# Patient Record
Sex: Male | Born: 1965 | Race: Black or African American | Hispanic: No | Marital: Married | Smoking: Never smoker
Health system: Southern US, Community
[De-identification: ages and names within clinical notes are randomized; demographics above are authoritative.]

## PROBLEM LIST (undated history)

## (undated) DIAGNOSIS — M1990 Unspecified osteoarthritis, unspecified site: Secondary | ICD-10-CM

## (undated) DIAGNOSIS — N189 Chronic kidney disease, unspecified: Secondary | ICD-10-CM

## (undated) DIAGNOSIS — K219 Gastro-esophageal reflux disease without esophagitis: Secondary | ICD-10-CM

## (undated) DIAGNOSIS — I5031 Acute diastolic (congestive) heart failure: Secondary | ICD-10-CM

## (undated) DIAGNOSIS — I10 Essential (primary) hypertension: Secondary | ICD-10-CM

## (undated) HISTORY — PX: FOOT SURGERY: SHX648

## (undated) HISTORY — PX: FRACTURE SURGERY: SHX138

---

## 2015-02-02 ENCOUNTER — Emergency Department (HOSPITAL_COMMUNITY): Payer: BLUE CROSS/BLUE SHIELD

## 2015-02-02 ENCOUNTER — Encounter (HOSPITAL_COMMUNITY): Payer: Self-pay | Admitting: Emergency Medicine

## 2015-02-02 ENCOUNTER — Inpatient Hospital Stay (HOSPITAL_COMMUNITY)
Admission: EM | Admit: 2015-02-02 | Discharge: 2015-02-04 | DRG: 291 | Disposition: A | Discharge status: Home / Self Care | Payer: BLUE CROSS/BLUE SHIELD | Attending: Internal Medicine | Admitting: Internal Medicine

## 2015-02-02 ENCOUNTER — Inpatient Hospital Stay (HOSPITAL_COMMUNITY): Payer: BLUE CROSS/BLUE SHIELD

## 2015-02-02 DIAGNOSIS — I5041 Acute combined systolic (congestive) and diastolic (congestive) heart failure: Secondary | ICD-10-CM | POA: Diagnosis present

## 2015-02-02 DIAGNOSIS — N179 Acute kidney failure, unspecified: Secondary | ICD-10-CM | POA: Diagnosis present

## 2015-02-02 DIAGNOSIS — T502X5A Adverse effect of carbonic-anhydrase inhibitors, benzothiadiazides and other diuretics, initial encounter: Secondary | ICD-10-CM | POA: Diagnosis present

## 2015-02-02 DIAGNOSIS — Z9119 Patient's noncompliance with other medical treatment and regimen: Secondary | ICD-10-CM | POA: Diagnosis present

## 2015-02-02 DIAGNOSIS — R0600 Dyspnea, unspecified: Secondary | ICD-10-CM | POA: Diagnosis not present

## 2015-02-02 DIAGNOSIS — N289 Disorder of kidney and ureter, unspecified: Secondary | ICD-10-CM

## 2015-02-02 DIAGNOSIS — E876 Hypokalemia: Secondary | ICD-10-CM | POA: Diagnosis present

## 2015-02-02 DIAGNOSIS — I160 Hypertensive urgency: Secondary | ICD-10-CM

## 2015-02-02 DIAGNOSIS — I129 Hypertensive chronic kidney disease with stage 1 through stage 4 chronic kidney disease, or unspecified chronic kidney disease: Secondary | ICD-10-CM | POA: Diagnosis present

## 2015-02-02 DIAGNOSIS — I5021 Acute systolic (congestive) heart failure: Secondary | ICD-10-CM | POA: Diagnosis present

## 2015-02-02 DIAGNOSIS — J9601 Acute respiratory failure with hypoxia: Secondary | ICD-10-CM | POA: Diagnosis present

## 2015-02-02 DIAGNOSIS — R0602 Shortness of breath: Secondary | ICD-10-CM | POA: Diagnosis present

## 2015-02-02 DIAGNOSIS — Z888 Allergy status to other drugs, medicaments and biological substances status: Secondary | ICD-10-CM

## 2015-02-02 DIAGNOSIS — I5031 Acute diastolic (congestive) heart failure: Secondary | ICD-10-CM | POA: Diagnosis not present

## 2015-02-02 DIAGNOSIS — I10 Essential (primary) hypertension: Secondary | ICD-10-CM

## 2015-02-02 DIAGNOSIS — J810 Acute pulmonary edema: Secondary | ICD-10-CM

## 2015-02-02 DIAGNOSIS — N189 Chronic kidney disease, unspecified: Secondary | ICD-10-CM | POA: Diagnosis present

## 2015-02-02 HISTORY — DX: Acute diastolic (congestive) heart failure: I50.31

## 2015-02-02 HISTORY — DX: Essential (primary) hypertension: I10

## 2015-02-02 LAB — CBC WITH DIFFERENTIAL/PLATELET
Basophils Absolute: 0 10*3/uL (ref 0.0–0.1)
Basophils Relative: 0 % (ref 0–1)
EOS ABS: 0.1 10*3/uL (ref 0.0–0.7)
Eosinophils Relative: 1 % (ref 0–5)
HEMATOCRIT: 42.8 % (ref 39.0–52.0)
HEMOGLOBIN: 14.6 g/dL (ref 13.0–17.0)
Lymphocytes Relative: 39 % (ref 12–46)
Lymphs Abs: 2.9 10*3/uL (ref 0.7–4.0)
MCH: 30.9 pg (ref 26.0–34.0)
MCHC: 34.1 g/dL (ref 30.0–36.0)
MCV: 90.7 fL (ref 78.0–100.0)
Monocytes Absolute: 0.6 10*3/uL (ref 0.1–1.0)
Monocytes Relative: 8 % (ref 3–12)
NEUTROS PCT: 52 % (ref 43–77)
Neutro Abs: 3.8 10*3/uL (ref 1.7–7.7)
PLATELETS: 189 10*3/uL (ref 150–400)
RBC: 4.72 MIL/uL (ref 4.22–5.81)
RDW: 13.6 % (ref 11.5–15.5)
WBC: 7.4 10*3/uL (ref 4.0–10.5)

## 2015-02-02 LAB — BRAIN NATRIURETIC PEPTIDE: B NATRIURETIC PEPTIDE 5: 559.4 pg/mL — AB (ref 0.0–100.0)

## 2015-02-02 LAB — HEPATIC FUNCTION PANEL
ALT: 23 U/L (ref 0–53)
AST: 27 U/L (ref 0–37)
Albumin: 3.3 g/dL — ABNORMAL LOW (ref 3.5–5.2)
Alkaline Phosphatase: 86 U/L (ref 39–117)
Bilirubin, Direct: 0.1 mg/dL (ref 0.0–0.5)
Indirect Bilirubin: 1 mg/dL — ABNORMAL HIGH (ref 0.3–0.9)
Total Bilirubin: 1.1 mg/dL (ref 0.3–1.2)
Total Protein: 6.9 g/dL (ref 6.0–8.3)

## 2015-02-02 LAB — I-STAT CHEM 8, ED
BUN: 41 mg/dL — ABNORMAL HIGH (ref 6–23)
CHLORIDE: 105 mmol/L (ref 96–112)
CREATININE: 2.5 mg/dL — AB (ref 0.50–1.35)
Calcium, Ion: 1.16 mmol/L (ref 1.12–1.23)
GLUCOSE: 89 mg/dL (ref 70–99)
HCT: 45 % (ref 39.0–52.0)
Hemoglobin: 15.3 g/dL (ref 13.0–17.0)
POTASSIUM: 3.8 mmol/L (ref 3.5–5.1)
Sodium: 142 mmol/L (ref 135–145)
TCO2: 23 mmol/L (ref 0–100)

## 2015-02-02 LAB — URINALYSIS, ROUTINE W REFLEX MICROSCOPIC
BILIRUBIN URINE: NEGATIVE
GLUCOSE, UA: NEGATIVE mg/dL
KETONES UR: NEGATIVE mg/dL
LEUKOCYTES UA: NEGATIVE
Nitrite: NEGATIVE
Protein, ur: 30 mg/dL — AB
Specific Gravity, Urine: 1.007 (ref 1.005–1.030)
UROBILINOGEN UA: 0.2 mg/dL (ref 0.0–1.0)
pH: 7 (ref 5.0–8.0)

## 2015-02-02 LAB — URINE MICROSCOPIC-ADD ON

## 2015-02-02 LAB — CREATININE, SERUM
Creatinine, Ser: 2.76 mg/dL — ABNORMAL HIGH (ref 0.50–1.35)
GFR calc Af Amer: 30 mL/min — ABNORMAL LOW (ref >90)
GFR calc non Af Amer: 26 mL/min — ABNORMAL LOW (ref >90)

## 2015-02-02 LAB — RAPID URINE DRUG SCREEN, HOSP PERFORMED
AMPHETAMINES: NOT DETECTED
Barbiturates: NOT DETECTED
Benzodiazepines: NOT DETECTED
Cocaine: NOT DETECTED
Opiates: NOT DETECTED
TETRAHYDROCANNABINOL: NOT DETECTED

## 2015-02-02 LAB — TROPONIN I
Troponin I: 0.05 ng/mL — ABNORMAL HIGH (ref <0.031)
Troponin I: 0.06 ng/mL — ABNORMAL HIGH (ref <0.031)
Troponin I: 0.06 ng/mL — ABNORMAL HIGH (ref <0.031)

## 2015-02-02 LAB — I-STAT TROPONIN, ED: TROPONIN I, POC: 0.02 ng/mL (ref 0.00–0.08)

## 2015-02-02 MED ORDER — SODIUM CHLORIDE 0.9 % IJ SOLN
3.0000 mL | INTRAMUSCULAR | Status: DC | PRN
Start: 2015-02-02 — End: 2015-02-04
  Administered 2015-02-02: 3 mL via INTRAVENOUS
  Filled 2015-02-02: qty 3

## 2015-02-02 MED ORDER — ACETAMINOPHEN 325 MG PO TABS
650.0000 mg | ORAL_TABLET | ORAL | Status: DC | PRN
  Administered 2015-02-02 – 2015-02-03 (×2): 650 mg via ORAL
  Filled 2015-02-02 (×2): qty 2

## 2015-02-02 MED ORDER — HEPARIN SODIUM (PORCINE) 5000 UNIT/ML IJ SOLN
5000.0000 [IU] | Freq: Three times a day (TID) | INTRAMUSCULAR | Status: DC
  Administered 2015-02-02 – 2015-02-04 (×6): 5000 [IU] via SUBCUTANEOUS
  Filled 2015-02-02 (×7): qty 1

## 2015-02-02 MED ORDER — HYDRALAZINE HCL 10 MG PO TABS
10.0000 mg | ORAL_TABLET | Freq: Three times a day (TID) | ORAL | Status: DC
  Administered 2015-02-02 – 2015-02-04 (×7): 10 mg via ORAL
  Filled 2015-02-02 (×8): qty 1

## 2015-02-02 MED ORDER — FUROSEMIDE 10 MG/ML IJ SOLN
20.0000 mg | Freq: Two times a day (BID) | INTRAMUSCULAR | Status: DC
  Administered 2015-02-02 – 2015-02-04 (×5): 20 mg via INTRAVENOUS
  Filled 2015-02-02 (×5): qty 2

## 2015-02-02 MED ORDER — LABETALOL HCL 5 MG/ML IV SOLN: 10.0000 mg | Freq: Once | INTRAVENOUS | Status: DC

## 2015-02-02 MED ORDER — LOSARTAN POTASSIUM 50 MG PO TABS
50.0000 mg | ORAL_TABLET | Freq: Two times a day (BID) | ORAL | Status: DC
  Administered 2015-02-02 (×2): 50 mg via ORAL
  Filled 2015-02-02 (×3): qty 1

## 2015-02-02 MED ORDER — FUROSEMIDE 10 MG/ML IJ SOLN
40.0000 mg | Freq: Once | INTRAMUSCULAR | Status: AC
  Administered 2015-02-02: 40 mg via INTRAVENOUS
  Filled 2015-02-02: qty 4

## 2015-02-02 MED ORDER — IPRATROPIUM-ALBUTEROL 0.5-2.5 (3) MG/3ML IN SOLN
RESPIRATORY_TRACT | Status: AC
  Filled 2015-02-02: qty 3

## 2015-02-02 MED ORDER — NITROGLYCERIN 2 % TD OINT
1.0000 [in_us] | TOPICAL_OINTMENT | Freq: Once | TRANSDERMAL | Status: AC
  Administered 2015-02-02: 1 [in_us] via TOPICAL
  Filled 2015-02-02: qty 30

## 2015-02-02 MED ORDER — ISOSORBIDE MONONITRATE 15 MG HALF TABLET
15.0000 mg | ORAL_TABLET | Freq: Every day | ORAL | Status: DC
  Administered 2015-02-02 – 2015-02-04 (×3): 15 mg via ORAL
  Filled 2015-02-02 (×3): qty 1

## 2015-02-02 MED ORDER — ASPIRIN EC 81 MG PO TBEC
81.0000 mg | DELAYED_RELEASE_TABLET | Freq: Every day | ORAL | Status: DC
  Administered 2015-02-02 – 2015-02-04 (×3): 81 mg via ORAL
  Filled 2015-02-02 (×3): qty 1

## 2015-02-02 MED ORDER — CARVEDILOL 3.125 MG PO TABS
3.1250 mg | ORAL_TABLET | Freq: Two times a day (BID) | ORAL | Status: DC
  Administered 2015-02-02 – 2015-02-04 (×5): 3.125 mg via ORAL
  Filled 2015-02-02 (×5): qty 1

## 2015-02-02 MED ORDER — HYDRALAZINE HCL 20 MG/ML IJ SOLN
5.0000 mg | Freq: Once | INTRAMUSCULAR | Status: AC
  Administered 2015-02-02: 5 mg via INTRAVENOUS
  Filled 2015-02-02: qty 1

## 2015-02-02 MED ORDER — POLYVINYL ALCOHOL 1.4 % OP SOLN
1.0000 [drp] | Freq: Three times a day (TID) | OPHTHALMIC | Status: DC | PRN
  Filled 2015-02-02: qty 15

## 2015-02-02 MED ORDER — IPRATROPIUM-ALBUTEROL 0.5-2.5 (3) MG/3ML IN SOLN
3.0000 mL | Freq: Once | RESPIRATORY_TRACT | Status: AC
  Administered 2015-02-02: 3 mL via RESPIRATORY_TRACT

## 2015-02-02 MED ORDER — SODIUM CHLORIDE 0.9 % IJ SOLN
3.0000 mL | Freq: Two times a day (BID) | INTRAMUSCULAR | Status: DC
  Administered 2015-02-03 – 2015-02-04 (×4): 3 mL via INTRAVENOUS

## 2015-02-02 MED ORDER — HYDRALAZINE HCL 20 MG/ML IJ SOLN
10.0000 mg | Freq: Four times a day (QID) | INTRAMUSCULAR | Status: DC | PRN
  Administered 2015-02-02 – 2015-02-03 (×2): 10 mg via INTRAVENOUS
  Filled 2015-02-02 (×2): qty 1

## 2015-02-02 MED ORDER — HYPROMELLOSE (GONIOSCOPIC) 2.5 % OP SOLN
1.0000 [drp] | Freq: Three times a day (TID) | OPHTHALMIC | Status: DC | PRN
  Filled 2015-02-02: qty 15

## 2015-02-02 MED ORDER — SODIUM CHLORIDE 0.9 % IV SOLN
250.0000 mL | INTRAVENOUS | Status: DC | PRN
Start: 2015-02-02 — End: 2015-02-04

## 2015-02-02 MED ORDER — ASPIRIN EC 81 MG PO TBEC: 81.0000 mg | DELAYED_RELEASE_TABLET | Freq: Every day | ORAL | Status: DC

## 2015-02-02 MED ORDER — ONDANSETRON HCL 4 MG/2ML IJ SOLN: 4.0000 mg | Freq: Four times a day (QID) | INTRAMUSCULAR | Status: DC | PRN

## 2015-02-02 NOTE — ED Notes (Signed)
Pt reports onset of SOB and wheezing tonight. Pt is not sure if he has asthma but reports SOB has happened before but went away away on its own.

## 2015-02-02 NOTE — H&P (Signed)
[facility] History and Physical  Cameron Peterson [id number] DOB: August 14, 1966 DOA: 02/02/2015  Referring physician:  PCP: No primary care provider on file.   Chief Complaint: SOB  HPI: Cameron Peterson is a 49 y.o. male with past medical history of hypertension, who comes in for shortness of breath that started 4 days prior to admission he relates that he noticed shortness of breath with exertion but did not pay much attention to it. But over the last 2 days he has not been able to sleep flat as he wakes up shortness of breath. 24 hours prior to admission he relates his PND got worse so he decided to come to the hospital. He denies any chest pain in the recent viral infections. He relates he has not been compliant with his hypertension medication. He relates no other new medications.  In the ED: He was found to be hypoxic on admission with a blood pressure of 200/123, new acute renal failure, chest x-ray that shows an interstitial diffuse pattern, with elevated BNP.   Review of Systems:  Constitutional:  No weight loss, night sweats, Fevers, chills, fatigue.  HEENT:  No headaches, Difficulty swallowing,Tooth/dental problems,Sore throat,  No sneezing, itching, ear ache, nasal congestion, post nasal drip,  Cardio-vascular:  swelling in lower extremities, anasarca, dizziness, palpitations  GI:  No heartburn, indigestion, abdominal pain, nausea, vomiting, diarrhea, change in bowel habits, loss of appetite  Resp:   No excess mucus, no productive cough, No non-productive cough, No coughing up of blood.No change in color of mucus.No wheezing.No chest wall deformity  Skin:  no rash or lesions.  GU:  no dysuria, change in color of urine, no urgency or frequency. No flank pain.  Musculoskeletal:  No joint pain or swelling. No decreased range of motion. No back pain.  Psych:  No change in mood or affect. No depression or anxiety. No memory loss.   Past Medical History  Diagnosis  Date  . Hypertension    History reviewed. No pertinent past surgical history. Social History:  reports that he has never smoked. He does not have any smokeless tobacco history on file. He reports that he drinks alcohol. He reports that he does not use illicit drugs.  Allergies  Allergen Reactions  . Lisinopril Swelling    Family History  Problem Relation Age of Onset  . Diabetes Mellitus II Father    Prior to Admission medications   Medication Sig Start Date End Date Taking? Authorizing Provider  aspirin EC 81 MG tablet Take 81 mg by mouth daily.   Yes Historical Provider, MD  hydroxypropyl methylcellulose / hypromellose (ISOPTO TEARS / GONIOVISC) 2.5 % ophthalmic solution Place 1 drop into both eyes 3 (three) times daily as needed for dry eyes.   Yes Historical Provider, MD  losartan (COZAAR) 50 MG tablet Take 50 mg by mouth 2 (two) times daily.   Yes Historical Provider, MD   Physical Exam: Filed Vitals:   02/02/15 0345 02/02/15 0451 02/02/15 0600 02/02/15 0630  BP: 210/130 190/130 192/103 180/125  Pulse:   64 67  Temp:      TempSrc:      Resp:   23 28  SpO2:   98% 99%    Wt Readings from Last 3 Encounters:  No data found for Wt    General:  Appears calm and comfortable Eyes: PERRL, normal lids, irises & conjunctiva ENT: grossly normal hearing, lips & tongue Neck: no LAD, masses or thyromegaly, +JVD Cardiovascular: RRR, no m/r/g. No LE edema.  Telemetry: SR, no arrhythmias  Respiratory: CTA bilaterally, no w/r/r. Normal respiratory effort. Abdomen: soft, ntnd Skin: no rash or induration seen on limited exam Musculoskeletal: grossly normal tone BUE/BLE Psychiatric: grossly normal mood and affect, speech fluent and appropriate Neurologic: grossly non-focal.          Labs on Admission:  Basic Metabolic Panel:  Recent Labs Lab 02/02/15 0456  NA 142  K 3.8  CL 105  GLUCOSE 89  BUN 41*  CREATININE 2.50*   Liver Function Tests:  Recent Labs Lab  02/02/15 0453  AST 27  ALT 23  ALKPHOS 86  BILITOT 1.1  PROT 6.9  ALBUMIN 3.3*   No results for input(s): LIPASE, AMYLASE in the last 168 hours. No results for input(s): AMMONIA in the last 168 hours. CBC:  Recent Labs Lab 02/02/15 0453 02/02/15 0456  WBC 7.4  --   NEUTROABS 3.8  --   HGB 14.6 15.3  HCT 42.8 45.0  MCV 90.7  --   PLT 189  --    Cardiac Enzymes: No results for input(s): CKTOTAL, CKMB, CKMBINDEX, TROPONINI in the last 168 hours.  BNP (last 3 results)  Recent Labs  02/02/15 0453  BNP 559.4*    ProBNP (last 3 results) No results for input(s): PROBNP in the last 8760 hours.  CBG: No results for input(s): GLUCAP in the last 168 hours.  Radiological Exams on Admission: Dg Chest 2 View (if Patient Has Fever And/or Copd)  02/02/2015   CLINICAL DATA:  Onset of shortness of breath and wheezing tonight.  EXAM: CHEST  2 VIEW  COMPARISON:  None.  FINDINGS: Heart size upper limits normal. Moderately severe Interstitial prominence in a fairly symmetric fashion throughout both lung fields is nonspecific. This could represent atypical edema, inhalational injury, viral pneumonia, or idiopathic. No prior chest radiographs to correlate. No reported history of asthma. No hilar or mediastinal masses are evident. No osseous findings. No effusion or pneumothorax.  IMPRESSION: Moderately severe interstitial prominence of uncertain significance and etiology. See discussion above.   Electronically Signed   By: Rolla Flatten M.D.   On: 02/02/2015 03:14    EKG: Independently reviewed. Normal sinus rhythm normal axis first degree AV block, no T-wave abnormalities.  Assessment/Plan Acute respiratory failure with hypoxia due to   Acute systolic congestive heart failure, NYHA class 1: - He had one dose of IV Lasix in the ED has been diuresing well, I agree with IV diuresis. - We'll start him on low-dose beta blocker continue low-dose IV Lasix as he is Lasix nave, cannot use  lisinopril due to history of angioedema. I will go ahead and start an ARB. Also add low-dose hydralazine and Imdur. - He relates he has been taking losartan and the past has not done so has he ran out of the medication and doesn't have a primary care physician. - Cycle cardiac enzymes get a 2-D echo. - UDS negative.  Malignant hypertension - We'll start him on oral hydralazine and Imdur. We'll continue Lasix. Resume ARB. - Check a 2-D echo.  Acute renal failure - We'll check a UA for protein, this most likely due to hypertensive disease. - Get a renal ultrasound SPEP and UPEP, check a TSH. - Hopefully his creatinine will improve with control his blood pressure.  Code Status: full DVT Prophylaxis:heparin Family Communication: none Disposition Plan: inpatient  Time spent: 70 min  Charlynne Cousins [facility] Pager [phone]

## 2015-02-02 NOTE — ED Provider Notes (Signed)
CSN: [id number]     Arrival date & time 02/02/15  0214 History   First MD Initiated Contact with Patient 02/02/15 0413     Chief Complaint  Patient presents with  . Shortness of Breath     (Consider location/radiation/quality/duration/timing/severity/associated sxs/prior Treatment) Patient is a 49 y.o. male presenting with shortness of breath. The history is provided by the patient.  Shortness of Breath Severity:  Moderate Onset quality:  Gradual Timing:  Constant Progression:  Worsening Chronicity:  New Context: not smoke exposure   Relieved by:  Nothing Worsened by:  Nothing tried Ineffective treatments:  None tried Associated symptoms: PND   Associated symptoms: no chest pain, no fever and no wheezing   Risk factors: no prolonged immobilization and no recent surgery     Past Medical History  Diagnosis Date  . Hypertension    History reviewed. No pertinent past surgical history. History reviewed. No pertinent family history. History  Substance Use Topics  . Smoking status: Never Smoker   . Smokeless tobacco: Not on file  . Alcohol Use: Yes    Review of Systems  Constitutional: Negative for fever.  Respiratory: Positive for shortness of breath. Negative for wheezing.   Cardiovascular: Positive for PND. Negative for chest pain, palpitations and leg swelling.  All other systems reviewed and are negative.     Allergies  Lisinopril  Home Medications   Prior to Admission medications   Medication Sig Start Date End Date Taking? Authorizing Provider  aspirin EC 81 MG tablet Take 81 mg by mouth daily.   Yes Historical Provider, MD  hydroxypropyl methylcellulose / hypromellose (ISOPTO TEARS / GONIOVISC) 2.5 % ophthalmic solution Place 1 drop into both eyes 3 (three) times daily as needed for dry eyes.   Yes Historical Provider, MD  losartan (COZAAR) 50 MG tablet Take 50 mg by mouth 2 (two) times daily.   Yes Historical Provider, MD   BP 190/130 mmHg  Pulse 74   Temp(Src) 97.4 F (36.3 C) (Oral)  Resp 18  SpO2 91% Physical Exam  Constitutional: He is oriented to person, place, and time. He appears well-developed and well-nourished.  HENT:  Head: Normocephalic and atraumatic.  Mouth/Throat: Oropharynx is clear and moist.  Eyes: Conjunctivae and EOM are normal. Pupils are equal, round, and reactive to light.  Neck: Normal range of motion. Neck supple.  Cardiovascular: Normal rate, regular rhythm and intact distal pulses.   Pulmonary/Chest: No stridor. No respiratory distress. He has rales.  Abdominal: Soft. Bowel sounds are normal. There is no tenderness. There is no rebound and no guarding.  Musculoskeletal: Normal range of motion. He exhibits no edema.  Neurological: He is alert and oriented to person, place, and time. He has normal reflexes.  Skin: Skin is warm and dry. He is not diaphoretic.  Psychiatric: He has a normal mood and affect.    ED Course  Procedures (including critical care time) Labs Review Labs Reviewed  I-STAT CHEM 8, ED - Abnormal; Notable for the following:    BUN 41 (*)    Creatinine, Ser 2.50 (*)    All other components within normal limits  CBC WITH DIFFERENTIAL/PLATELET  BRAIN NATRIURETIC PEPTIDE  URINE RAPID DRUG SCREEN (HOSP PERFORMED)  HEPATIC FUNCTION PANEL  I-STAT TROPOININ, ED    Imaging Review Dg Chest 2 View (if Patient Has Fever And/or Copd)  02/02/2015   CLINICAL DATA:  Onset of shortness of breath and wheezing tonight.  EXAM: CHEST  2 VIEW  COMPARISON:  None.  FINDINGS: Heart size upper limits normal. Moderately severe Interstitial prominence in a fairly symmetric fashion throughout both lung fields is nonspecific. This could represent atypical edema, inhalational injury, viral pneumonia, or idiopathic. No prior chest radiographs to correlate. No reported history of asthma. No hilar or mediastinal masses are evident. No osseous findings. No effusion or pneumothorax.  IMPRESSION: Moderately severe  interstitial prominence of uncertain significance and etiology. See discussion above.   Electronically Signed   By: Rolla Flatten M.D.   On: 02/02/2015 03:14     EKG Interpretation   Date/Time:  Sunday Consuela Widener 24 2016 03:41:49 EDT Ventricular Rate:  74 PR Interval:  208 QRS Duration: 93 QT Interval:  410 QTC Calculation: 455 R Axis:   55 Text Interpretation:  Sinus rhythm Borderline prolonged PR interval  Consider left ventricular hypertrophy Borderline T abnormalities, lateral  leads Confirmed by [facility]  MD, Itzia Cunliffe ([id number]) on 02/02/2015 3:47:19  AM      MDM   Final diagnoses:  None    Medications  ipratropium-albuterol (DUONEB) 0.5-2.5 (3) MG/3ML nebulizer solution 3 mL (3 mLs Nebulization Given 02/02/15 0234)  furosemide (LASIX) injection 40 mg (40 mg Intravenous Given 02/02/15 0517)  hydrALAZINE (APRESOLINE) injection 5 mg (5 mg Intravenous Given 02/02/15 0513)  nitroGLYCERIN (NITROGLYN) 2 % ointment 1 inch (1 inch Topical Given 02/02/15 0513)   Results for orders placed or performed during the hospital encounter of 02/02/15  CBC with Differential/Platelet  Result Value Ref Range   WBC 7.4 4.0 - 10.5 K/uL   RBC 4.72 4.22 - 5.81 MIL/uL   Hemoglobin 14.6 13.0 - 17.0 g/dL   HCT 42.8 39.0 - 52.0 %   MCV 90.7 78.0 - 100.0 fL   MCH 30.9 26.0 - 34.0 pg   MCHC 34.1 30.0 - 36.0 g/dL   RDW 13.6 11.5 - 15.5 %   Platelets 189 150 - 400 K/uL   Neutrophils Relative % 52 43 - 77 %   Neutro Abs 3.8 1.7 - 7.7 K/uL   Lymphocytes Relative 39 12 - 46 %   Lymphs Abs 2.9 0.7 - 4.0 K/uL   Monocytes Relative 8 3 - 12 %   Monocytes Absolute 0.6 0.1 - 1.0 K/uL   Eosinophils Relative 1 0 - 5 %   Eosinophils Absolute 0.1 0.0 - 0.7 K/uL   Basophils Relative 0 0 - 1 %   Basophils Absolute 0.0 0.0 - 0.1 K/uL  I-stat chem 8, ed  Result Value Ref Range   Sodium 142 135 - 145 mmol/L   Potassium 3.8 3.5 - 5.1 mmol/L   Chloride 105 96 - 112 mmol/L   BUN 41 (H) 6 - 23 mg/dL   Creatinine, Ser  2.50 (H) 0.50 - 1.35 mg/dL   Glucose, Bld 89 70 - 99 mg/dL   Calcium, Ion 1.16 1.12 - 1.23 mmol/L   TCO2 23 0 - 100 mmol/L   Hemoglobin 15.3 13.0 - 17.0 g/dL   HCT 45.0 39.0 - 52.0 %  I-stat troponin, ED  Result Value Ref Range   Troponin i, poc 0.02 0.00 - 0.08 ng/mL   Comment 3           Dg Chest 2 View (if Patient Has Fever And/or Copd)  02/02/2015   CLINICAL DATA:  Onset of shortness of breath and wheezing tonight.  EXAM: CHEST  2 VIEW  COMPARISON:  None.  FINDINGS: Heart size upper limits normal. Moderately severe Interstitial prominence in a fairly symmetric fashion throughout both lung fields is  nonspecific. This could represent atypical edema, inhalational injury, viral pneumonia, or idiopathic. No prior chest radiographs to correlate. No reported history of asthma. No hilar or mediastinal masses are evident. No osseous findings. No effusion or pneumothorax.  IMPRESSION: Moderately severe interstitial prominence of uncertain significance and etiology. See discussion above.   Electronically Signed   By: Rolla Flatten M.D.   On: 02/02/2015 03:14    Will admit    Aubryn Spinola, MD 02/02/15 [id number]

## 2015-02-02 NOTE — ED Notes (Signed)
Please call Blaine Hamper @ [phone] when pt gets a room.

## 2015-02-02 NOTE — Plan of Care (Signed)
Problem: Consults Goal: Heart Failure Patient Education (See Patient Education module for education specifics.)  Outcome: Progressing Educated pt on importance of [place] and Medication Compliance

## 2015-02-02 NOTE — ED Notes (Signed)
Hospitalist MD at bedside. 

## 2015-02-03 DIAGNOSIS — R0600 Dyspnea, unspecified: Secondary | ICD-10-CM

## 2015-02-03 LAB — PROTEIN ELECTROPHORESIS, SERUM
A/G RATIO SPE: 0.9 (ref 0.7–2.0)
Albumin ELP: 3.1 g/dL — ABNORMAL LOW (ref 3.2–5.6)
Alpha-1-Globulin: 0.3 g/dL (ref 0.1–0.4)
Alpha-2-Globulin: 0.7 g/dL (ref 0.4–1.2)
Beta Globulin: 1 g/dL (ref 0.6–1.3)
Gamma Globulin: 1.6 g/dL (ref 0.5–1.6)
Globulin, Total: 3.5 g/dL (ref 2.0–4.5)
M-Spike, %: 0.3 g/dL — ABNORMAL HIGH
Total Protein ELP: 6.6 g/dL (ref 6.0–8.5)

## 2015-02-03 LAB — BASIC METABOLIC PANEL
ANION GAP: 11 (ref 5–15)
BUN: 42 mg/dL — ABNORMAL HIGH (ref 6–23)
CO2: 26 mmol/L (ref 19–32)
CREATININE: 2.64 mg/dL — AB (ref 0.50–1.35)
Calcium: 9.4 mg/dL (ref 8.4–10.5)
Chloride: 107 mmol/L (ref 96–112)
GFR, EST AFRICAN AMERICAN: 31 mL/min — AB (ref >90)
GFR, EST NON AFRICAN AMERICAN: 27 mL/min — AB (ref >90)
GLUCOSE: 105 mg/dL — AB (ref 70–99)
Potassium: 3.3 mmol/L — ABNORMAL LOW (ref 3.5–5.1)
Sodium: 144 mmol/L (ref 135–145)

## 2015-02-03 MED ORDER — POTASSIUM CHLORIDE CRYS ER 20 MEQ PO TBCR
20.0000 meq | EXTENDED_RELEASE_TABLET | Freq: Two times a day (BID) | ORAL | Status: DC
  Administered 2015-02-03 – 2015-02-04 (×3): 20 meq via ORAL
  Filled 2015-02-03 (×3): qty 1

## 2015-02-03 NOTE — Care Management Note (Addendum)
    Page 1 of 1   02/04/2015     1:07:00 PM CARE MANAGEMENT NOTE 02/04/2015  Patient:  Cameron Peterson, Cameron Peterson   Account Number:  [id number]  Date Initiated:  02/03/2015  Documentation initiated by:  Dessa Phi  Subjective/Objective Assessment:   49 y/o m admitted w/CHF.     Action/Plan:   From home.has pcp,pharmacy.   Anticipated DC Date:  02/04/2015   Anticipated DC Plan:  [place]  CM consult      Choice offered to / List presented to:             Status of service:  Completed, signed off Medicare Important Message given?   (If response is "NO", the following Medicare IM given date fields will be blank) Date Medicare IM given:   Medicare IM given by:   Date Additional Medicare IM given:   Additional Medicare IM given by:    Discharge Disposition:  HOME/SELF CARE  Per UR Regulation:  Reviewed for med. necessity/level of care/duration of stay  If discussed at [place] of Stay Meetings, dates discussed:    Comments:  02/04/15 Dessa Phi RN BSN NCM [phone] d/c home no needs or orders.  02/03/15 Dessa Phi RN BSN NCM [phone] Monitor progress.Patient has pcp-has not gone to 1st appt yet.Provided w/pcp listing as a back up resource.No anticipated d/c needs.

## 2015-02-03 NOTE — Progress Notes (Signed)
  Echocardiogram 2D Echocardiogram has been performed.  Daquisha Clermont 02/03/2015, 10:21 AM

## 2015-02-03 NOTE — Progress Notes (Signed)
Progress Note   Cameron Peterson [id number] DOB: December 12, 1965 DOA: 02/02/2015 PCP: No primary care provider on file.  Going to see a Office manager in [place] next month.   Brief Narrative:   Cameron Peterson is an 49 y.o. male with a PMH of hypertension, noncompliant with losartan therapy who was admitted 02/02/15 with a four-day history of worsening shortness of breath and a one-day history of paroxysmal nocturnal dyspnea/orthopnea. Upon initial evaluation in the ED, the patient's blood pressure was 200/123, his chest x-ray showed interstitial infiltrates, his BNP was 559.4 and his creatinine was 2.50.  Assessment/Plan:   Principal Problem:   Acute systolic congestive heart failure, NYHA class 1 / troponin I elevation - Patient is being diuresed. I/O balance -4.5 L/24 hours. - Low-dose Coreg started. Also started on hydralazine, Imdur and Cozaar. Discontinue Cozaar given acute renal failure. - Cardiac markers mildly elevated troponins 0.05-0.06. - 2-D echocardiogram ordered, and is pending. - Continue 2 g sodium diet. - Elevated troponins likely reflective of decompensated CHF. Continue aspirin. - Continue to monitor on telemetry.  Active Problems:   Hypokalemia - Secondary to diuretics. Replete.    Acute renal failure - Likely end organ damage from hypertensive urgency/malignant hypertension. Hold Cozaar for now. - Likely has some underlying chronic kidney disease given echogenic kidneys on renal ultrasound. - Baseline creatinine unknown.    Acute respiratory failure with hypoxia - Secondary to decompensated CHF. - Should improve with diuresis. Supplemental oxygen as needed.    Malignant hypertension - Continue Coreg, hydralazine and Imdur. Blood pressure improving but still suboptimally controlled.    DVT Prophylaxis - Continue heparin.  Code Status: Full. Family Communication: No family at bedside, declines my offer to call family, saying "I already talked to  them". Disposition Plan: Home when BP stable and work up completed, likely 1-2 days.  Lives alone.   IV Access:    Peripheral IV   Procedures and diagnostic studies:   Dg Chest 2 View (if Patient Has Fever And/or Copd) 02/02/2015: Moderately severe interstitial prominence of uncertain significance and etiology.  US Renal 02/02/2015: 1. Echogenic kidneys, which is nonspecific but most commonly associated with medical renal disease in the setting of acute renal failure. 2. Tiny RIGHT inferior pole simple renal cyst. 3. Negative for hydronephrosis.  No calculi.      Medical Consultants:    None.  Anti-Infectives:    None.  Subjective:   Cameron Peterson is breathing much better.  Denies chest pain, cough, nausea/vomiting.  Last BM was earlier today.  Objective:    Filed Vitals:   02/02/15 2345 02/03/15 0209 02/03/15 0514 02/03/15 0551  BP: 161/115 154/107 158/107   Pulse:  80 69   Temp:  98.5 F (36.9 C) 98.2 F (36.8 C)   TempSrc:  Oral Oral   Resp:  18 18   Height:      Weight:    105.4 kg (232 lb 5.8 oz)  SpO2:  99% 100%     Intake/Output Summary (Last 24 hours) at 02/03/15 [id number] Last data filed at 02/03/15 0503  Gross per 24 hour  Intake    750 ml  Output   5270 ml  Net  -4520 ml    Exam: Gen:  NAD Cardiovascular:  RRR, No M/R/G Respiratory:  Lungs CTAB Gastrointestinal:  Abdomen soft, NT/ND, + BS Extremities:  No C/E/C   Data Reviewed:    Labs: Basic Metabolic Panel:  Recent Labs Lab 02/02/15 0456 02/02/15 0907 02/03/15 0534  NA 142  --  144  K 3.8  --  3.3*  CL 105  --  107  CO2  --   --  26  GLUCOSE 89  --  105*  BUN 41*  --  42*  CREATININE 2.50* 2.76* 2.64*  CALCIUM  --   --  9.4   GFR Estimated Creatinine Clearance: 45.6 mL/min (by C-G formula based on Cr of 2.64). Liver Function Tests:  Recent Labs Lab 02/02/15 0453  AST 27  ALT 23  ALKPHOS 86  BILITOT 1.1  PROT 6.9  ALBUMIN 3.3*   CBC:  Recent Labs Lab  02/02/15 0453 02/02/15 0456  WBC 7.4  --   NEUTROABS 3.8  --   HGB 14.6 15.3  HCT 42.8 45.0  MCV 90.7  --   PLT 189  --    Cardiac Enzymes:  Recent Labs Lab 02/02/15 0907 02/02/15 1512 02/02/15 2046  TROPONINI 0.05* 0.06* 0.06*    Microbiology No results found for this or any previous visit (from the past 240 hour(s)).   Medications:   . aspirin EC  81 mg Oral Daily  . carvedilol  3.125 mg Oral BID WC  . furosemide  20 mg Intravenous Q12H  . heparin  5,000 Units Subcutaneous 3 times per day  . hydrALAZINE  10 mg Oral 3 times per day  . isosorbide mononitrate  15 mg Oral Daily  . losartan  50 mg Oral BID  . sodium chloride  3 mL Intravenous Q12H   Continuous Infusions:   Time spent: 35 minutes with > 50% of time discussing current diagnostic test results, clinical impression and plan of care.   LOS: 1 day   RAMA,CHRISTINA  [facility] Pager [phone]. If unable to reach me by pager, please call my cell phone at [phone].  *Please refer to [email or web address], password [email or web address] to get updated schedule on who will round on this patient, as hospitalists switch teams weekly. If 7PM-7AM, please contact night-coverage at [email or web address], password [email or web address] for any overnight needs.  02/03/2015, 7:33 AM

## 2015-02-04 ENCOUNTER — Encounter (HOSPITAL_COMMUNITY): Payer: Self-pay | Admitting: Internal Medicine

## 2015-02-04 DIAGNOSIS — I5031 Acute diastolic (congestive) heart failure: Secondary | ICD-10-CM

## 2015-02-04 HISTORY — DX: Acute diastolic (congestive) heart failure: I50.31

## 2015-02-04 LAB — UIFE/LIGHT CHAINS/TP QN, 24-HR UR
ALBUMIN, U: DETECTED
ALPHA 2 UR: DETECTED — AB
Alpha 1, Urine: DETECTED — AB
Beta, Urine: DETECTED — AB
Gamma Globulin, Urine: DETECTED — AB
Total Protein, Urine: 41 mg/dL — ABNORMAL HIGH (ref 5–25)

## 2015-02-04 LAB — BASIC METABOLIC PANEL
Anion gap: 7 (ref 5–15)
BUN: 46 mg/dL — AB (ref 6–23)
CHLORIDE: 106 mmol/L (ref 96–112)
CO2: 25 mmol/L (ref 19–32)
CREATININE: 3.25 mg/dL — AB (ref 0.50–1.35)
Calcium: 9.3 mg/dL (ref 8.4–10.5)
GFR, EST AFRICAN AMERICAN: 24 mL/min — AB (ref >90)
GFR, EST NON AFRICAN AMERICAN: 21 mL/min — AB (ref >90)
Glucose, Bld: 91 mg/dL (ref 70–99)
Potassium: 3.6 mmol/L (ref 3.5–5.1)
Sodium: 138 mmol/L (ref 135–145)

## 2015-02-04 MED ORDER — CARVEDILOL 3.125 MG PO TABS: 3.1250 mg | ORAL_TABLET | Freq: Two times a day (BID) | ORAL | Status: AC

## 2015-02-04 MED ORDER — FUROSEMIDE 40 MG PO TABS: 40.0000 mg | ORAL_TABLET | Freq: Every day | ORAL | Status: DC | PRN

## 2015-02-04 MED ORDER — ISOSORBIDE MONONITRATE ER 30 MG PO TB24: 15.0000 mg | ORAL_TABLET | Freq: Every day | ORAL | Status: DC

## 2015-02-04 MED ORDER — HYDRALAZINE HCL 10 MG PO TABS: 10.0000 mg | ORAL_TABLET | Freq: Three times a day (TID) | ORAL | Status: DC

## 2015-02-04 NOTE — Discharge Instructions (Signed)
Managing Your High Blood Pressure Blood pressure is a measurement of how forceful your blood is pressing against the walls of the arteries. Arteries are muscular tubes within the circulatory system. Blood pressure does not stay the same. Blood pressure rises when you are active, excited, or nervous; and it lowers during sleep and relaxation. If the numbers measuring your blood pressure stay above normal most of the time, you are at risk for health problems. High blood pressure (hypertension) is a long-term (chronic) condition in which blood pressure is elevated. A blood pressure reading is recorded as two numbers, such as 120 over 80 (or 120/80). The first, higher number is called the systolic pressure. It is a measure of the pressure in your arteries as the heart beats. The second, lower number is called the diastolic pressure. It is a measure of the pressure in your arteries as the heart relaxes between beats.  Keeping your blood pressure in a normal range is important to your overall health and prevention of health problems, such as heart disease and stroke. When your blood pressure is uncontrolled, your heart has to work harder than normal. High blood pressure is a very common condition in adults because blood pressure tends to rise with age. Men and women are equally likely to have hypertension but at different times in life. Before age 33, men are more likely to have hypertension. After 49 years of age, women are more likely to have it. Hypertension is especially common in African Americans. This condition often has no signs or symptoms. The cause of the condition is usually not known. Your caregiver can help you come up with a plan to keep your blood pressure in a normal, healthy range. BLOOD PRESSURE STAGES Blood pressure is classified into four stages: normal, prehypertension, stage 1, and stage 2. Your blood pressure reading will be used to determine what type of treatment, if any, is necessary.  Appropriate treatment options are tied to these four stages:  Normal  Systolic pressure (mm Hg): below 120.  Diastolic pressure (mm Hg): below 80. Prehypertension  Systolic pressure (mm Hg): 120 to 139.  Diastolic pressure (mm Hg): 80 to 89. Stage1  Systolic pressure (mm Hg): 140 to 159.  Diastolic pressure (mm Hg): 90 to 99. Stage2  Systolic pressure (mm Hg): 160 or above.  Diastolic pressure (mm Hg): 100 or above. RISKS RELATED TO HIGH BLOOD PRESSURE Managing your blood pressure is an important responsibility. Uncontrolled high blood pressure can lead to:  A heart attack.  A stroke.  A weakened blood vessel (aneurysm).  Heart failure.  Kidney damage.  Eye damage.  Metabolic syndrome.  Memory and concentration problems. HOW TO MANAGE YOUR BLOOD PRESSURE Blood pressure can be managed effectively with lifestyle changes and medicines (if needed). Your caregiver will help you come up with a plan to bring your blood pressure within a normal range. Your plan should include the following: Education  Read all information provided by your caregivers about how to control blood pressure.  Educate yourself on the latest guidelines and treatment recommendations. New research is always being done to further define the risks and treatments for high blood pressure. Lifestylechanges  Control your weight.  Avoid smoking.  Stay physically active.  Reduce the amount of salt in your diet.  Reduce stress.  Control any chronic conditions, such as high cholesterol or diabetes.  Reduce your alcohol intake. Medicines  Several medicines (antihypertensive medicines) are available, if needed, to bring blood pressure within a normal range.  Communication  Review all the medicines you take with your caregiver because there may be side effects or interactions.  Talk with your caregiver about your diet, exercise habits, and other lifestyle factors that may be contributing to  high blood pressure.  See your caregiver regularly. Your caregiver can help you create and adjust your plan for managing high blood pressure. RECOMMENDATIONS FOR TREATMENT AND FOLLOW-UP  The following recommendations are based on current guidelines for managing high blood pressure in nonpregnant adults. Use these recommendations to identify the proper follow-up period or treatment option based on your blood pressure reading. You can discuss these options with your caregiver.  Systolic pressure of 123456 to [id number] or diastolic pressure of 80 to 89: Follow up with your caregiver as directed.  Systolic pressure of XX123456 to 0000000 or diastolic pressure of 90 to 100: Follow up with your caregiver within 2 months.  Systolic pressure above 0000000 or diastolic pressure above 123XX123: Follow up with your caregiver within 1 month.  Systolic pressure above 99991111 or diastolic pressure above [id number]: Consider antihypertensive therapy; follow up with your caregiver within 1 week.  Systolic pressure above A999333 or diastolic pressure above 123456: Begin antihypertensive therapy; follow up with your caregiver within 1 week. Document Released: 06/21/2012 Document Reviewed: 06/21/2012 [facility] Patient Information 2015 [place]. This information is not intended to replace advice given to you by your health care provider. Make sure you discuss any questions you have with your health care provider.

## 2015-02-04 NOTE — Progress Notes (Signed)
D/c home no needs or orders.

## 2015-02-04 NOTE — Discharge Summary (Signed)
Physician Discharge Summary  Cameron Peterson [id number] DOB: Feb 17, 1966 DOA: 02/02/2015  PCP: Dewain Penning, MD  Admit date: 02/02/2015 Discharge date: 02/04/2015   Recommendations for Outpatient Follow-Up:   1. The patient has F/U scheduled with his PCP in 3 weeks. 2. Please check renal function at that visit. 3. Note: Cozaar on hold.  May resume if renal function improved/stable.   Discharge Diagnosis:   Principal Problem:    Acute diastolic CHF (congestive heart failure), NYHA class 1 Active Problems:    Acute renal failure    Acute respiratory failure with hypoxia    Malignant hypertension   Discharge disposition:  Home.    Discharge Condition: Improved.  Diet recommendation: Low sodium, heart healthy.    History of Present Illness:   Cameron Peterson is an 49 y.o. male with a PMH of hypertension, noncompliant with losartan therapy who was admitted 02/02/15 with a four-day history of worsening shortness of breath and a one-day history of paroxysmal nocturnal dyspnea/orthopnea. Upon initial evaluation in the ED, the patient's blood pressure was 200/123, his chest x-ray showed interstitial infiltrates, his BNP was 559.4 and his creatinine was 2.50.  Hospital Course by Problem:   Principal Problem:  Acute diastolic congestive heart failure, NYHA class 1 / troponin I elevation - Patient was diuresed. I/O balance - 7 L/48 hours. - Continue Coreg, hydralazine, and Imdur. Cozaar on hold secondary to acute renal failure. - Cardiac markers mildly elevated troponins 0.05-0.06. - 2-D echocardiogram done 02/03/15: EF 50-55 percent with grade 2 diastolic dysfunction. No regional wall motion abnormalities. - Continue 2 g sodium diet. - Elevated troponins likely reflective of decompensated CHF. Continue aspirin.  Active Problems:  Hypokalemia - Secondary to diuretics. Repleted.   Acute renal failure - Likely end organ damage from hypertensive urgency/malignant  hypertension. Continue to hold Cozaar for now. - Likely has some underlying chronic kidney disease given echogenic kidneys on renal ultrasound. - Baseline creatinine unknown.   Acute respiratory failure with hypoxia - Secondary to decompensated CHF. - Improved with diuresis.    Malignant hypertension - Continue Coreg, hydralazine and Imdur. Blood pressure improving but still suboptimally controlled.   Medical Consultants:    None.   Discharge Exam:   Filed Vitals:   02/04/15 0538  BP: 149/72  Pulse: 58  Temp: 98 F (36.7 C)  Resp: 20   Filed Vitals:   02/03/15 1759 02/03/15 1845 02/03/15 2100 02/04/15 0538  BP: 164/108 168/105 160/107 149/72  Pulse: 56 62 72 58  Temp:   97.5 F (36.4 C) 98 F (36.7 C)  TempSrc:   Oral Oral  Resp:   18 20  Height:      Weight:    105.053 kg (231 lb 9.6 oz)  SpO2:   100% 100%    Gen:  NAD Cardiovascular:  RRR, No M/R/G Respiratory: Lungs CTAB Gastrointestinal: Abdomen soft, NT/ND with normal active bowel sounds. Extremities: No C/E/C   The results of significant diagnostics from this hospitalization (including imaging, microbiology, ancillary and laboratory) are listed below for reference.     Procedures and Diagnostic Studies:   Dg Chest 2 View (if Patient Has Fever And/or Copd) 02/02/2015: Moderately severe interstitial prominence of uncertain significance and etiology.  US Renal 02/02/2015: 1. Echogenic kidneys, which is nonspecific but most commonly associated with medical renal disease in the setting of acute renal failure. 2. Tiny RIGHT inferior pole simple renal cyst. 3. Negative for hydronephrosis. No calculi.    Labs:   Basic Metabolic Panel:  Recent Labs Lab 02/02/15 0456 02/02/15 0907 02/03/15 0534 02/04/15 0545  NA 142  --  144 138  K 3.8  --  3.3* 3.6  CL 105  --  107 106  CO2  --   --  26 25  GLUCOSE 89  --  105* 91  BUN 41*  --  42* 46*  CREATININE 2.50* 2.76* 2.64* 3.25*  CALCIUM  --   --   9.4 9.3   GFR Estimated Creatinine Clearance: 37 mL/min (by C-G formula based on Cr of 3.25). Liver Function Tests:  Recent Labs Lab 02/02/15 0453  AST 27  ALT 23  ALKPHOS 86  BILITOT 1.1  PROT 6.9  ALBUMIN 3.3*   CBC:  Recent Labs Lab 02/02/15 0453 02/02/15 0456  WBC 7.4  --   NEUTROABS 3.8  --   HGB 14.6 15.3  HCT 42.8 45.0  MCV 90.7  --   PLT 189  --    Cardiac Enzymes:  Recent Labs Lab 02/02/15 0907 02/02/15 1512 02/02/15 2046  TROPONINI 0.05* 0.06* 0.06*     Discharge Instructions:       Discharge Instructions    (HEART FAILURE PATIENTS) Call MD:  Anytime you have any of the following symptoms: 1) 3 pound weight gain in 24 hours or 5 pounds in 1 week 2) shortness of breath, with or without a dry hacking cough 3) swelling in the hands, feet or stomach 4) if you have to sleep on extra pillows at night in order to breathe.    Complete by:  As directed      Diet - low sodium heart healthy    Complete by:  As directed      Discharge instructions    Complete by:  As directed   You were treated for heart failure in the hospital.  To prevent exacerbations of your heart failure, it is important that you check your weight at the same time every day, and that if you gain over 3 pounds, OR you develop worsening swelling to the legs, experience more shortness of breath or chest pain, you call your Primary MD immediately.   Follow a heart healthy, low salt diet and restrict your fluid intake to 1.5 liters a day or less.     Increase activity slowly    Complete by:  As directed      Other Restrictions    Complete by:  As directed   May return to work on 02/05/15 if you are no longer short of breath.  Please excuse from work 02/02/15-02/04/15.            Medication List    STOP taking these medications        losartan 50 MG tablet  Commonly known as:  COZAAR      TAKE these medications        aspirin EC 81 MG tablet  Take 81 mg by mouth daily.      carvedilol 3.125 MG tablet  Commonly known as:  COREG  Take 1 tablet (3.125 mg total) by mouth 2 (two) times daily with a meal.     furosemide 40 MG tablet  Commonly known as:  LASIX  Take 1 tablet (40 mg total) by mouth daily as needed for fluid or edema (3 pound weight gain/24 hrs, shortness of breath).     hydrALAZINE 10 MG tablet  Commonly known as:  APRESOLINE  Take 1 tablet (10 mg total) by mouth every 8 (eight) hours.  hydroxypropyl methylcellulose / hypromellose 2.5 % ophthalmic solution  Commonly known as:  ISOPTO TEARS / GONIOVISC  Place 1 drop into both eyes 3 (three) times daily as needed for dry eyes.     isosorbide mononitrate 30 MG 24 hr tablet  Commonly known as:  IMDUR  Take 0.5 tablets (15 mg total) by mouth daily.       Follow-up Information    Follow up with Dewain Penning, MD On 02/24/2015.   Specialty:  Family Medicine   Why:  At your scheduled appointment at 8 am.   Contact information:   [place] [address] [phone]        Time coordinating discharge: 35 minutes.  Signed:  RAMA,CHRISTINA  Pager [phone] [facility] 02/04/2015, 12:19 PM

## 2016-01-28 IMAGING — US US RENAL
1 series · 14 of 25 positions shown · non-contrast
Comparison: None.

CLINICAL DATA: Acute kidney injury.  Elevated creatinine and BUN.

EXAM:
RENAL/URINARY TRACT ULTRASOUND COMPLETE

[Series 1: us renal · 0.21mm/px · 14 of 43 slices shown]
[im 1/43]
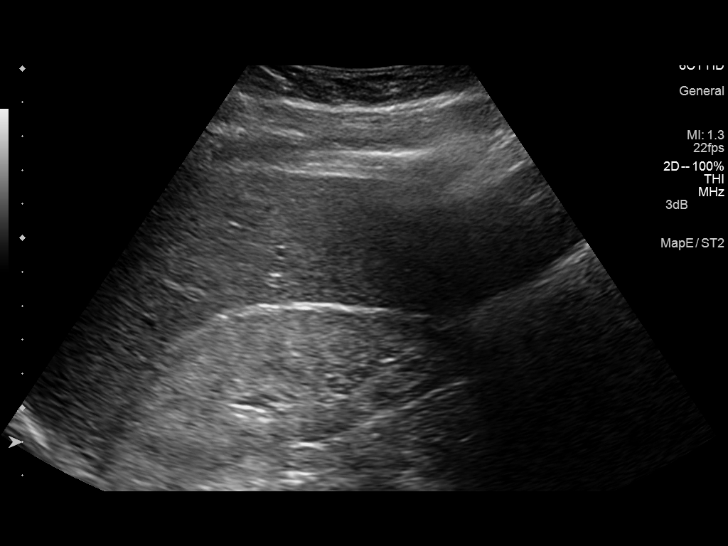
[im 4/43]
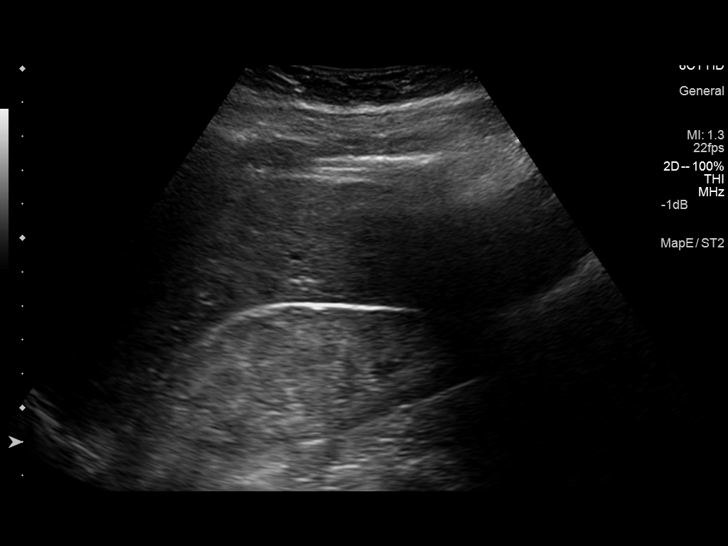
[im 8/43]
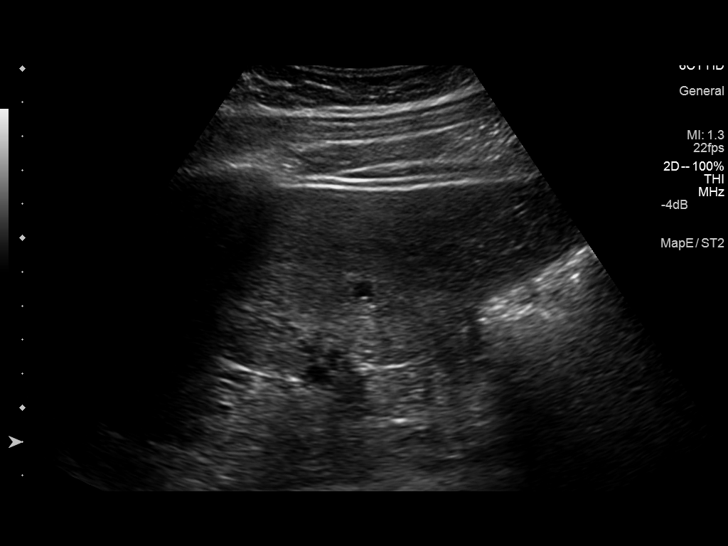
[im 11/43]
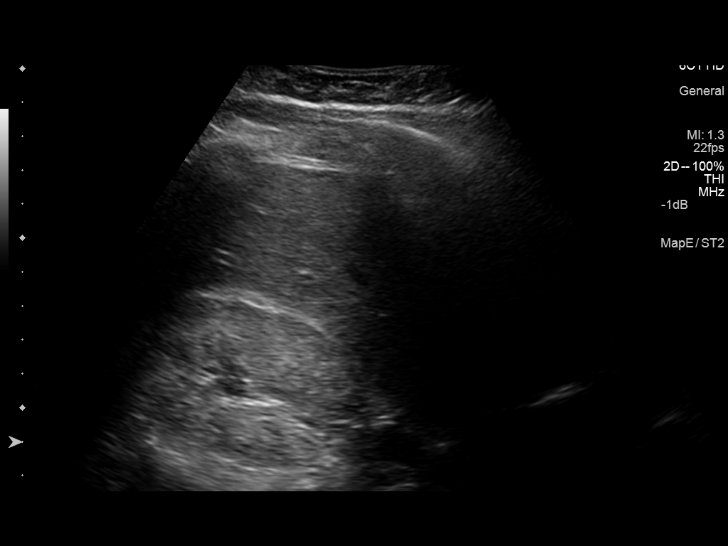
[im 15/43]
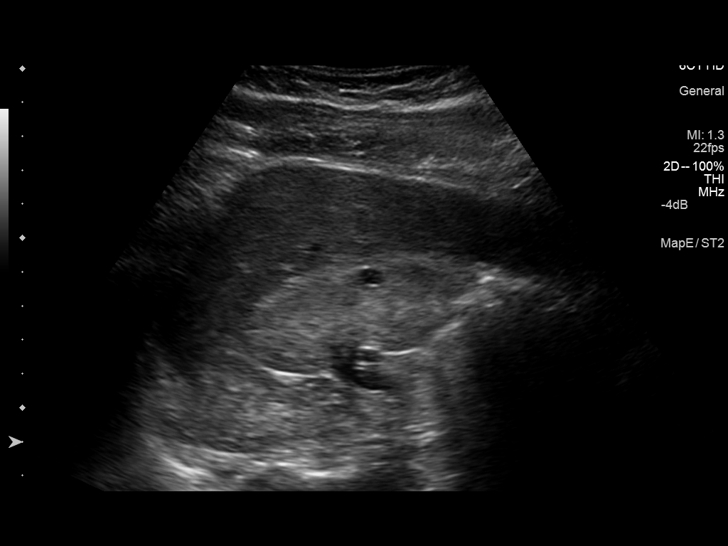
[im 16/43]
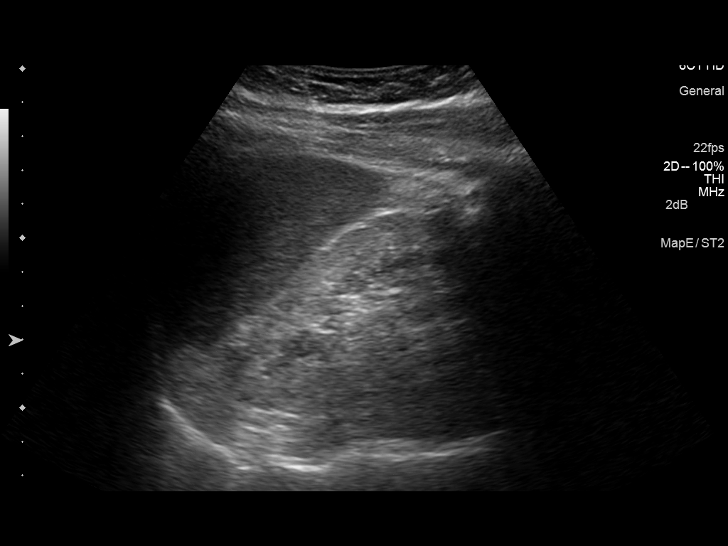
[im 20/43]
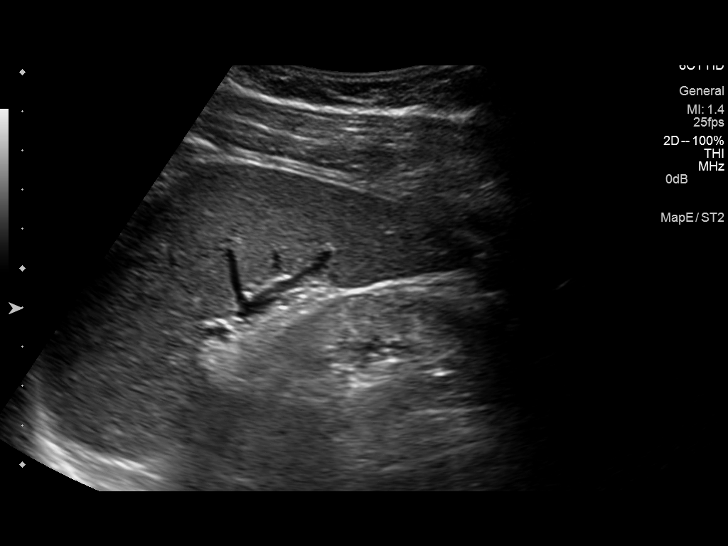
[im 23/43]
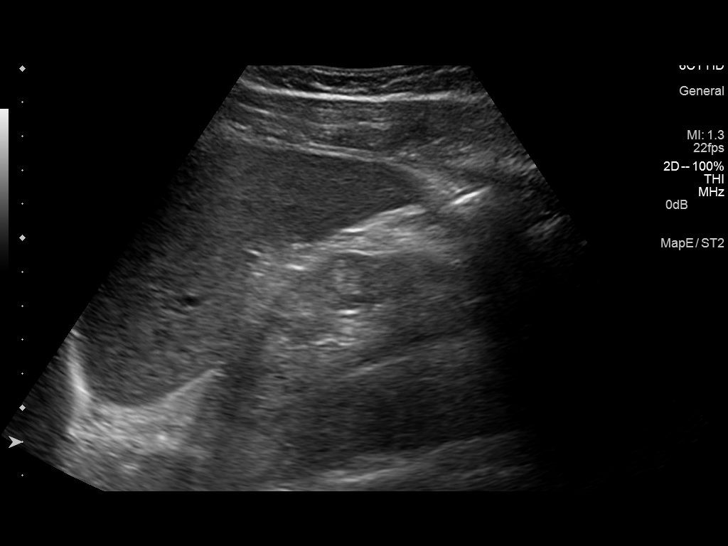
[im 27/43]
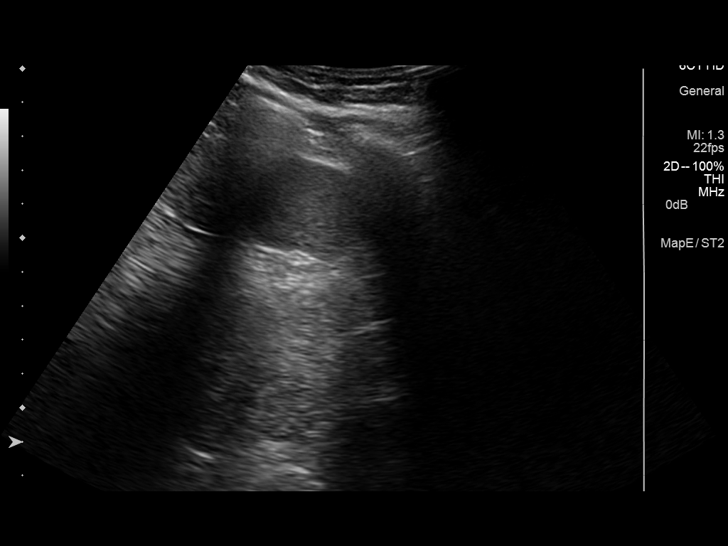
[im 29/43]
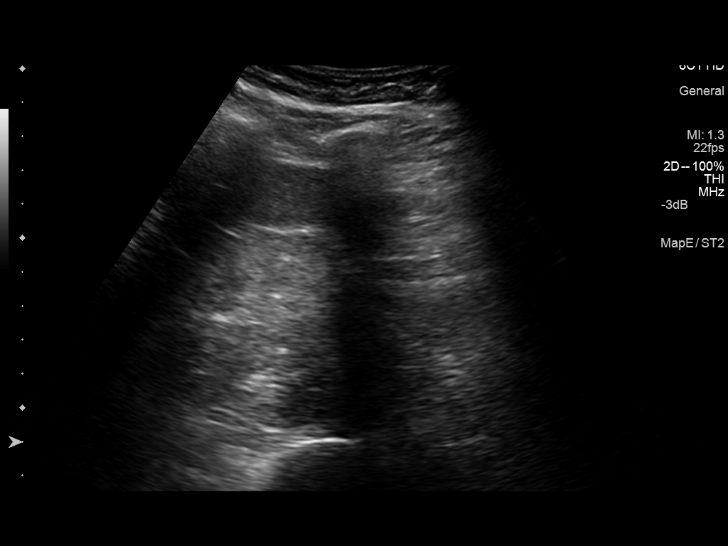
[im 32/43]
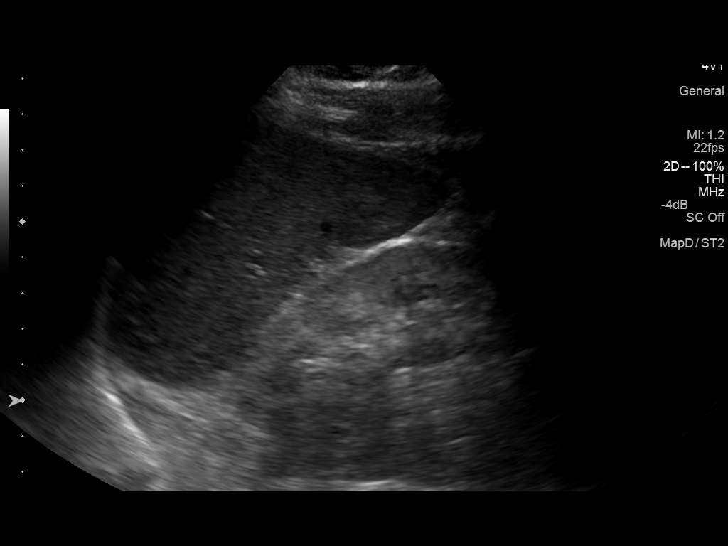
[im 36/43]
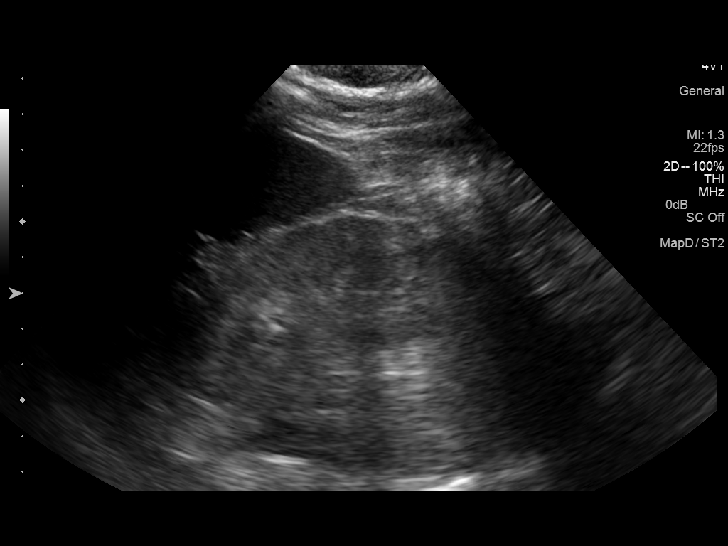
[im 39/43]
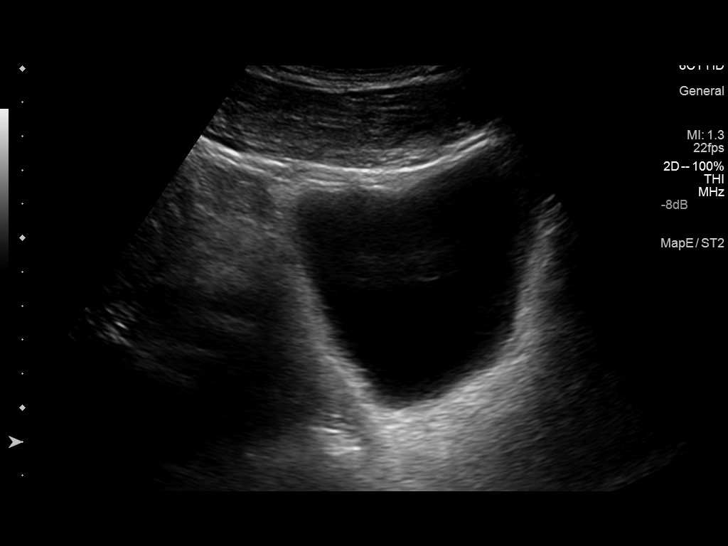
[im 43/43]
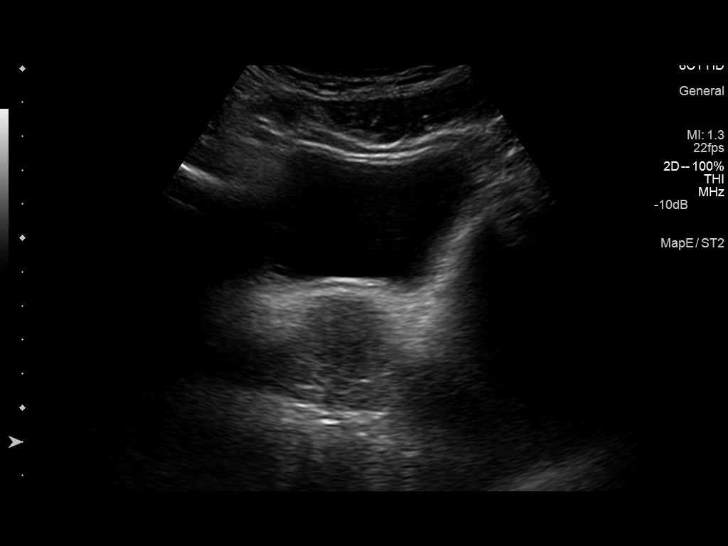

[14 of 25 positions shown; findings below may reference images not displayed]

FINDINGS: Right Kidney:

Length: 10.9 cm. Cortex is echogenic, which can be associated with
acute renal failure. Tiny cyst is present in the inferior pole
measuring 6 mm.

Left Kidney:

Length: 9.9 cm.  Echogenic cortex.  No mass lesion.

Bladder:

Appears normal for degree of bladder distention.
IMPRESSION: 1. Echogenic kidneys, which is nonspecific but most commonly
associated with medical renal disease in the setting of acute renal
failure.
2. Tiny RIGHT inferior pole simple renal cyst.
3. Negative for hydronephrosis.  No calculi.

## 2017-11-07 DIAGNOSIS — I517 Cardiomegaly: Secondary | ICD-10-CM | POA: Insufficient documentation

## 2021-10-11 DIAGNOSIS — U071 COVID-19: Secondary | ICD-10-CM

## 2021-10-11 HISTORY — DX: COVID-19: U07.1

## 2022-01-19 ENCOUNTER — Inpatient Hospital Stay (HOSPITAL_COMMUNITY)
Admission: EM | Admit: 2022-01-19 | Discharge: 2022-01-25 | DRG: 291 | Disposition: A | Discharge status: Home / Self Care | Payer: 59 | Attending: Internal Medicine | Admitting: Internal Medicine

## 2022-01-19 ENCOUNTER — Encounter (HOSPITAL_COMMUNITY): Payer: Self-pay | Admitting: Emergency Medicine

## 2022-01-19 ENCOUNTER — Inpatient Hospital Stay (HOSPITAL_COMMUNITY): Payer: 59

## 2022-01-19 DIAGNOSIS — E785 Hyperlipidemia, unspecified: Secondary | ICD-10-CM | POA: Diagnosis present

## 2022-01-19 DIAGNOSIS — N189 Chronic kidney disease, unspecified: Secondary | ICD-10-CM | POA: Diagnosis present

## 2022-01-19 DIAGNOSIS — Z79899 Other long term (current) drug therapy: Secondary | ICD-10-CM | POA: Diagnosis not present

## 2022-01-19 DIAGNOSIS — Z7952 Long term (current) use of systemic steroids: Secondary | ICD-10-CM | POA: Diagnosis not present

## 2022-01-19 DIAGNOSIS — R195 Other fecal abnormalities: Secondary | ICD-10-CM | POA: Diagnosis present

## 2022-01-19 DIAGNOSIS — K219 Gastro-esophageal reflux disease without esophagitis: Secondary | ICD-10-CM | POA: Diagnosis present

## 2022-01-19 DIAGNOSIS — I10 Essential (primary) hypertension: Secondary | ICD-10-CM | POA: Diagnosis not present

## 2022-01-19 DIAGNOSIS — E872 Acidosis, unspecified: Secondary | ICD-10-CM | POA: Diagnosis not present

## 2022-01-19 DIAGNOSIS — M1A3710 Chronic gout due to renal impairment, right ankle and foot, without tophus (tophi): Secondary | ICD-10-CM

## 2022-01-19 DIAGNOSIS — I132 Hypertensive heart and chronic kidney disease with heart failure and with stage 5 chronic kidney disease, or end stage renal disease: Secondary | ICD-10-CM | POA: Diagnosis present

## 2022-01-19 DIAGNOSIS — Z888 Allergy status to other drugs, medicaments and biological substances status: Secondary | ICD-10-CM

## 2022-01-19 DIAGNOSIS — N184 Chronic kidney disease, stage 4 (severe): Secondary | ICD-10-CM

## 2022-01-19 DIAGNOSIS — R630 Anorexia: Secondary | ICD-10-CM | POA: Diagnosis present

## 2022-01-19 DIAGNOSIS — Z992 Dependence on renal dialysis: Secondary | ICD-10-CM | POA: Diagnosis not present

## 2022-01-19 DIAGNOSIS — I472 Ventricular tachycardia, unspecified: Secondary | ICD-10-CM | POA: Diagnosis not present

## 2022-01-19 DIAGNOSIS — N185 Chronic kidney disease, stage 5: Principal | ICD-10-CM

## 2022-01-19 DIAGNOSIS — N179 Acute kidney failure, unspecified: Secondary | ICD-10-CM | POA: Diagnosis present

## 2022-01-19 DIAGNOSIS — Z7982 Long term (current) use of aspirin: Secondary | ICD-10-CM

## 2022-01-19 DIAGNOSIS — D631 Anemia in chronic kidney disease: Secondary | ICD-10-CM | POA: Diagnosis present

## 2022-01-19 DIAGNOSIS — N186 End stage renal disease: Secondary | ICD-10-CM | POA: Diagnosis present

## 2022-01-19 DIAGNOSIS — N2581 Secondary hyperparathyroidism of renal origin: Secondary | ICD-10-CM | POA: Diagnosis present

## 2022-01-19 DIAGNOSIS — M069 Rheumatoid arthritis, unspecified: Secondary | ICD-10-CM | POA: Diagnosis present

## 2022-01-19 DIAGNOSIS — M109 Gout, unspecified: Secondary | ICD-10-CM | POA: Diagnosis present

## 2022-01-19 DIAGNOSIS — Z833 Family history of diabetes mellitus: Secondary | ICD-10-CM

## 2022-01-19 DIAGNOSIS — I5032 Chronic diastolic (congestive) heart failure: Secondary | ICD-10-CM | POA: Diagnosis present

## 2022-01-19 DIAGNOSIS — M10372 Gout due to renal impairment, left ankle and foot: Secondary | ICD-10-CM

## 2022-01-19 DIAGNOSIS — E559 Vitamin D deficiency, unspecified: Secondary | ICD-10-CM | POA: Diagnosis present

## 2022-01-19 DIAGNOSIS — M898X9 Other specified disorders of bone, unspecified site: Secondary | ICD-10-CM | POA: Diagnosis present

## 2022-01-19 DIAGNOSIS — R7303 Prediabetes: Secondary | ICD-10-CM | POA: Diagnosis present

## 2022-01-19 DIAGNOSIS — Z6832 Body mass index (BMI) 32.0-32.9, adult: Secondary | ICD-10-CM | POA: Diagnosis not present

## 2022-01-19 LAB — CBC WITH DIFFERENTIAL/PLATELET
Abs Immature Granulocytes: 0.06 10*3/uL (ref 0.00–0.07)
Basophils Absolute: 0 10*3/uL (ref 0.0–0.1)
Basophils Relative: 0 %
Eosinophils Absolute: 0 10*3/uL (ref 0.0–0.5)
Eosinophils Relative: 0 %
HCT: 26.9 % — ABNORMAL LOW (ref 39.0–52.0)
Hemoglobin: 8.9 g/dL — ABNORMAL LOW (ref 13.0–17.0)
Immature Granulocytes: 1 %
Lymphocytes Relative: 27 %
Lymphs Abs: 2.4 10*3/uL (ref 0.7–4.0)
MCH: 31.9 pg (ref 26.0–34.0)
MCHC: 33.1 g/dL (ref 30.0–36.0)
MCV: 96.4 fL (ref 80.0–100.0)
Monocytes Absolute: 0.6 10*3/uL (ref 0.1–1.0)
Monocytes Relative: 7 %
Neutro Abs: 5.8 10*3/uL (ref 1.7–7.7)
Neutrophils Relative %: 65 %
Platelets: 294 10*3/uL (ref 150–400)
RBC: 2.79 MIL/uL — ABNORMAL LOW (ref 4.22–5.81)
RDW: 15.1 % (ref 11.5–15.5)
WBC: 8.9 10*3/uL (ref 4.0–10.5)
nRBC: 0 % (ref 0.0–0.2)

## 2022-01-19 LAB — URINALYSIS, ROUTINE W REFLEX MICROSCOPIC
Bilirubin Urine: NEGATIVE
Glucose, UA: 150 mg/dL — AB
Hgb urine dipstick: NEGATIVE
Ketones, ur: NEGATIVE mg/dL
Leukocytes,Ua: NEGATIVE
Nitrite: NEGATIVE
Protein, ur: >=300 mg/dL — AB
Specific Gravity, Urine: 1.016 (ref 1.005–1.030)
pH: 6 (ref 5.0–8.0)

## 2022-01-19 LAB — COMPREHENSIVE METABOLIC PANEL
ALT: 14 U/L (ref 0–44)
AST: 12 U/L — ABNORMAL LOW (ref 15–41)
Albumin: 1.6 g/dL — ABNORMAL LOW (ref 3.5–5.0)
Alkaline Phosphatase: 51 U/L (ref 38–126)
Anion gap: 17 — ABNORMAL HIGH (ref 5–15)
BUN: 95 mg/dL — ABNORMAL HIGH (ref 6–20)
CO2: 15 mmol/L — ABNORMAL LOW (ref 22–32)
Calcium: 7.5 mg/dL — ABNORMAL LOW (ref 8.9–10.3)
Chloride: 111 mmol/L (ref 98–111)
Creatinine, Ser: 14.45 mg/dL — ABNORMAL HIGH (ref 0.61–1.24)
GFR, Estimated: 4 mL/min — ABNORMAL LOW (ref >60)
Glucose, Bld: 117 mg/dL — ABNORMAL HIGH (ref 70–99)
Potassium: 4.2 mmol/L (ref 3.5–5.1)
Sodium: 143 mmol/L (ref 135–145)
Total Bilirubin: 0.5 mg/dL (ref 0.3–1.2)
Total Protein: 5.6 g/dL — ABNORMAL LOW (ref 6.5–8.1)

## 2022-01-19 MED ORDER — PREDNISONE 10 MG PO TABS
10.0000 mg | ORAL_TABLET | Freq: Every day | ORAL | Status: DC
  Administered 2022-01-20 – 2022-01-22 (×3): 10 mg via ORAL
  Filled 2022-01-19 (×4): qty 1

## 2022-01-19 NOTE — Assessment & Plan Note (Signed)
Reports recent flareup of his left foot and is currently on steroid taper.  Will continue. ?

## 2022-01-19 NOTE — ED Provider Triage Note (Signed)
Emergency Medicine Provider Triage Evaluation Note ? ?Cameron Peterson , a 56 y.o. male  was evaluated in triage.  Pt complains of sent from his doctor for abnormal labs.  He states that he has a history of CKD stage IV.  He recently established care here in [place].  He states he had labs drawn at outside hospital and had a abnormal creatinine and GFR and was told to come here.  I am unable to view these results. ? ? ?Physical Exam  ?BP (!) 148/98 (BP Location: Right Arm)   Pulse 81   Temp 98.5 ?F (36.9 ?C)   Resp 16   SpO2 100%  ?Gen:   Awake, no distress   ?Resp:  Normal effort  ?MSK:   Moves extremities without difficulty  ?Other:  Normal speech ? ?Medical Decision Making  ?Medically screening exam initiated at 5:30 PM.  Appropriate orders placed.  Cameron Peterson was informed that the remainder of the evaluation will be completed by another provider, this initial triage assessment does not replace that evaluation, and the importance of remaining in the ED until their evaluation is complete. ? ?Note: Portions of this report may have been transcribed using voice recognition software. Every effort was made to ensure accuracy; however, inadvertent computerized transcription errors may be present ? ?  ?Cameron Peterson, [place] ?01/19/22 1731 ? ?

## 2022-01-19 NOTE — H&P (Signed)
?History and Physical  ? ? ?Patient: Cameron Peterson VCB:[id number] DOB: April 22, 1966 ?DOA: 01/19/2022 ?DOS: the patient was seen and examined on 01/19/2022 ?PCP: Benito Mccreedy, MD  ?Patient coming from: Home ? ?Chief Complaint:  ?Chief Complaint  ?Patient presents with  ? Abnormal Lab  ? ?HPI: Cameron Peterson is a 56 y.o. male with medical history significant of CKD stage 4, Rheumatoid arthritis, gout, HTN, chronic diastolic HF, pre-DM and HLD who presents with worsening renal function. ? ?He presented to PCP today for routine checkup and blood work and was found to have elevated creatinine.  Repeat creatinine here of 14.45 with anion gap of 17.  Last creatinine in 06/2021 was at 4.61.  He notes nausea with vomiting in the past 2 days with epigastric abdominal pain.  Denies any flank pain.  Reports normal urine output has been taking Lasix.  He recently ran out of prednisone for his rheumatoid arthritis for a month and a half and has been taking naproxen for his pain.  Reports taking 3 to 5 pills at a time during his flareup but not daily. ? ?CBC showing anemia of 8.9 from 12.4.  Calcium of 7.5 with albumin 1.6 which corrects to normal at 9. ?UA showing greater than 300 protein and 150 glucose. ?EKG on my review with sinus rhythm and low voltage ? ?Review of Systems: As mentioned in the history of present illness. All other systems reviewed and are negative. ?Past Medical History:  ?Diagnosis Date  ? Acute diastolic CHF (congestive heart failure), NYHA class 1 ([place]) 02/04/2015  ? Hypertension   ? ?No past surgical history on file. ?Social History:  reports that he has never smoked. He does not have any smokeless tobacco history on file. He reports current alcohol use. He reports that he does not use drugs. ? ?Allergies  ?Allergen Reactions  ? Lisinopril Swelling  ? ? ?Family History  ?Problem Relation Age of Onset  ? Diabetes Mellitus II Father   ? ? ?Prior to Admission medications   ?Medication Sig Start Date End  Date Taking? Authorizing Provider  ?aspirin EC 81 MG tablet Take 81 mg by mouth daily.    [provider]  ?carvedilol (COREG) 3.125 MG tablet Take 1 tablet (3.125 mg total) by mouth 2 (two) times daily with a meal. 02/04/15   Rama, Venetia Maxon, MD  ?furosemide (LASIX) 40 MG tablet Take 1 tablet (40 mg total) by mouth daily as needed for fluid or edema (3 pound weight gain/24 hrs, shortness of breath). 02/04/15   Rama, Venetia Maxon, MD  ?hydrALAZINE (APRESOLINE) 10 MG tablet Take 1 tablet (10 mg total) by mouth every 8 (eight) hours. 02/04/15   Rama, Venetia Maxon, MD  ?hydroxypropyl methylcellulose / hypromellose (ISOPTO TEARS / GONIOVISC) 2.5 % ophthalmic solution Place 1 drop into both eyes 3 (three) times daily as needed for dry eyes.    [provider]  ?isosorbide mononitrate (IMDUR) 30 MG 24 hr tablet Take 0.5 tablets (15 mg total) by mouth daily. 02/04/15   Rama, Venetia Maxon, MD  ? ? ?Physical Exam: ?Vitals:  ? 01/19/22 1717 01/19/22 1928 01/19/22 2000 01/19/22 2030  ?BP: (!) 148/98 (!) 137/96 (!) 144/92 (!) 146/90  ?Pulse: 81 78 78 74  ?Resp: 16 16 17 14   ?Temp: 98.5 ?F (36.9 ?C) 97.8 ?F (36.6 ?C)    ?TempSrc:  Oral    ?SpO2: 100% 100% 100% 100%  ? ?Constitutional: NAD, calm, comfortable, middle-age male laying flat in bed ?Eyes: lids and conjunctivae  normal ?ENMT: Mucous membranes are moist.   ?Neck: normal, supple ?Respiratory: clear to auscultation bilaterally, no wheezing, no crackles. Normal respiratory effort.  ?Cardiovascular: Regular rate and rhythm, no murmurs / rubs / gallops.  Trace pitting edema of left lower ankle ?Abdomen: no tenderness, no masses palpated.  Bowel sounds positive.  ?Musculoskeletal: no clubbing / cyanosis. No joint deformity upper and lower extremities. Good ROM, no contractures. Normal muscle tone.  ?Skin: no rashes, lesions, ulcers. No induration ?Neurologic: CN 2-12 grossly intact. Strength 5/5 in all 4.  ?Psychiatric: Normal judgment and insight. Alert and  oriented x 3. Normal mood. ?Data Reviewed: ? ?See HPI ? ?Assessment and Plan: ?* Acute on chronic kidney failure ([place]) ?History of CKD stage IV with last creatinine in 06/2021 of 4.61.  Presents with creatinine of 14.45 with symptoms of uremia with nausea and vomiting.  Reports increased use of NSAIDs for rheumatoid arthritis which likely caused intrinsic kidney injury.  No changes in urinary output but will obtain renal ultrasound. ?-Obtain urine urea, creatinine and sodium ?-Give 500cc LR overnight and follow repeat creatinine ?-avoid nephrotoxic agent or contrasted study.  Will renally dose all medications. ?-Nephrology is aware and will see in consultation.  Patient likely will require hemodialysis. ? ?Essential hypertension ?Continue antihypertensives once med rec is complete ? ?Diastolic CHF, chronic (HCC) ?Stable.  Will hold Lasix due to acute renal failure. ? ?Gout ?Reports recent flareup of his left foot and is currently on steroid taper.  Will continue. ? ? ? ? ? Advance Care Planning:   Code Status: Full Code  ? ?Consults: Nephrology ? ?Family Communication: Discussed with wife at bedside and answered all questions and concerns ? ?Severity of Illness: ?The appropriate patient status for this patient is INPATIENT. Inpatient status is judged to be reasonable and necessary in order to provide the required intensity of service to ensure the patient's safety. The patient's presenting symptoms, physical exam findings, and initial radiographic and laboratory data in the context of their chronic comorbidities is felt to place them at high risk for further clinical deterioration. Furthermore, it is not anticipated that the patient will be medically stable for discharge from the hospital within 2 midnights of admission.  ? ?* I certify that at the point of admission it is my clinical judgment that the patient will require inpatient hospital care spanning beyond 2 midnights from the point of admission due to high  intensity of service, high risk for further deterioration and high frequency of surveillance required.* ? ?Author: Orene Desanctis, DO ?01/19/2022 11:55 PM ? ?For on call review [email or web address].  ?

## 2022-01-19 NOTE — ED Provider Notes (Signed)
?[facility] ?Provider Note ? ? ?CSN: [id number] ?Arrival date & time: 01/19/22  1610 ? ?  ? ?History ? ?Chief Complaint  ?Patient presents with  ? Abnormal Lab  ? ? ?Cameron Peterson is a 56 y.o. male. 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 ? ?Per patient he went to PCP today for normal visit. States his PCP called him this afternoon to present to ED for abnormal kidney labs. He states that he was being followed by a nephrologist in [place], but recently moved to [place] and has not been re-established. He states that he has chronic renal failure from uncontrolled hypertension. He is currently asymptomatic. Denies chest pain, shortness of breath, abdominal pain, N/V/D. Denies recent illnesses.  ? ?Abnormal Lab ? ?  ? ?Home Medications ?Prior to Admission medications   ?Medication Sig Start Date End Date Taking? Authorizing Provider  ?aspirin EC 81 MG tablet Take 81 mg by mouth daily.    [provider]  ?carvedilol (COREG) 3.125 MG tablet Take 1 tablet (3.125 mg total) by mouth 2 (two) times daily with a meal. 02/04/15   Rama, Venetia Maxon, MD  ?furosemide (LASIX) 40 MG tablet Take 1 tablet (40 mg total) by mouth daily as needed for fluid or edema (3 pound weight gain/24 hrs, shortness of breath). 02/04/15   Rama, Venetia Maxon, MD  ?hydrALAZINE (APRESOLINE) 10 MG tablet Take 1 tablet (10 mg total) by mouth every 8 (eight) hours. 02/04/15   Rama, Venetia Maxon, MD  ?hydroxypropyl methylcellulose / hypromellose (ISOPTO TEARS / GONIOVISC) 2.5 % ophthalmic solution Place 1 drop into both eyes 3 (three) times daily as needed for dry eyes.    [provider]  ?isosorbide mononitrate (IMDUR) 30 MG 24 hr tablet Take 0.5 tablets (15 mg total) by mouth daily. 02/04/15   Rama, Venetia Maxon, MD  ?   ? ?Allergies    ?Lisinopril   ? ?Review of Systems   ?Review of Systems  ?All other systems  reviewed and are negative. ? ?Physical Exam ?Updated Vital Signs ?BP (!) 137/96 (BP Location: Right Arm)   Pulse 78   Temp 97.8 ?F (36.6 ?C) (Oral)   Resp 16   SpO2 100%  ?Physical Exam ?Vitals and nursing note reviewed.  ?Constitutional:   ?   General: He is not in acute distress. ?   Appearance: Normal appearance. He is not ill-appearing or toxic-appearing.  ?HENT:  ?   Head: Normocephalic and atraumatic.  ?   Nose: Nose normal. No congestion.  ?   Mouth/Throat:  ?   Mouth: Mucous membranes are moist.  ?   Pharynx: Oropharynx is clear.  ?Eyes:  ?   General: No scleral icterus. ?   Extraocular Movements: Extraocular movements intact.  ?   Conjunctiva/sclera: Conjunctivae normal.  ?   Pupils: Pupils are equal, round, and reactive to light.  ?Cardiovascular:  ?   Rate and Rhythm: Normal rate and regular rhythm.  ?   Pulses: Normal pulses.     ?     Radial pulses are 2+ on the right side and 2+ on the left side.  ?     Dorsalis pedis pulses are 2+ on the right side and 2+ on the left side.  ?   Heart sounds: Normal heart sounds. No murmur heard. ?Pulmonary:  ?   Effort: Pulmonary effort is normal. No respiratory distress.  ?  Breath sounds: Normal breath sounds.  ?Abdominal:  ?   General: Bowel sounds are normal. There is no distension.  ?   Palpations: Abdomen is soft.  ?   Tenderness: There is no abdominal tenderness.  ?Musculoskeletal:     ?   General: Normal range of motion.  ?   Cervical back: Normal range of motion and neck supple.  ?   Right lower leg: 1+ Pitting Edema present.  ?   Left lower leg: 1+ Pitting Edema present.  ?Skin: ?   General: Skin is warm and dry.  ?   Capillary Refill: Capillary refill takes less than 2 seconds.  ?Neurological:  ?   General: No focal deficit present.  ?   Mental Status: He is alert and oriented to person, place, and time. Mental status is at baseline.  ?Psychiatric:     ?   Mood and Affect: Mood normal.     ?   Behavior: Behavior normal.     ?   Thought Content: Thought  content normal.     ?   Judgment: Judgment normal.  ? ? ?ED Results / Procedures / Treatments   ?Labs ?(all labs ordered are listed, but only abnormal results are displayed) ?Labs Reviewed  ?COMPREHENSIVE METABOLIC PANEL - Abnormal; Notable for the following components:  ?    Result Value  ? CO2 15 (*)   ? Glucose, Bld 117 (*)   ? BUN 95 (*)   ? Creatinine, Ser 14.45 (*)   ? Calcium 7.5 (*)   ? Total Protein 5.6 (*)   ? Albumin 1.6 (*)   ? AST 12 (*)   ? GFR, Estimated 4 (*)   ? Anion gap 17 (*)   ? All other components within normal limits  ?CBC WITH DIFFERENTIAL/PLATELET - Abnormal; Notable for the following components:  ? RBC 2.79 (*)   ? Hemoglobin 8.9 (*)   ? HCT 26.9 (*)   ? All other components within normal limits  ?URINALYSIS, ROUTINE W REFLEX MICROSCOPIC - Abnormal; Notable for the following components:  ? Glucose, UA 150 (*)   ? Protein, ur >=300 (*)   ? Bacteria, UA RARE (*)   ? All other components within normal limits  ? ?EKG ?None ? ?Radiology ?No results found. ? ?Procedures ?Procedures  ? ?Medications Ordered in ED ?Medications - No data to display ? ?ED Course/ Medical Decision Making/ A&P ?  ?                        ?Medical Decision Making ?Amount and/or Complexity of Data Reviewed ?Labs: ordered. ? ?Risk ?Decision regarding hospitalization. ? ?This patient presents to the ED for concern of elevated creatinine, this involves an extensive number of treatment options, and is a complaint that carries with it a high risk of complications and morbidity.  The differential diagnosis includes acute renal failure, acute on chronic renal failure, hyperkalemia, etc.  ? ?Co morbidities that complicate the patient evaluation ?Chronic renal failure, hypertension, heart failure  ? ?Additional history obtained:  ?Additional history obtained from: Wife at bedside  ?External records from outside source obtained and reviewed including: primary care visit and most recent nephrology visit  ? ?EKG: ?EKG: normal EKG,  normal sinus rhythm.  ? ?Cardiac Monitoring: ?The patient was maintained on a cardiac monitor.  I personally viewed and interpreted the cardiac monitored which showed an underlying rhythm of: sinus rhythm  ? ?Lab Results: ?I personally ordered,  reviewed, and interpreted labs. ?Pertinent results include: ?CBC with hgb 8.9, from [facility] visits, appears stable. 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 ?Imaging Studies ordered:  ?None  ? ?Medications  ?I ordered medication including none ?Reevaluation of the patient after medication shows that patient  n/a ?-I reviewed the patient's home medications and did not make adjustments. ?-I did not prescribe new home medications. ? ?Tests Considered: ?Renal ultrasound - spoke with nephrology who would like AKI admission work up and will reassess.  ? ?Critical Interventions: ?N/a ? ?Consultations: ?I requested consultation with the Nephrologist, Dr. Posey Pronto ,  and discussed lab and imaging findings as well as pertinent plan - they recommend: AKI workup first and will consult tomorrow to decide on dialysis. Does not need emergent dialysis at this time.  ? ?SDH ?No identified SDH issues needing addressing now ? ?ED Course: ?56 year old male who presents to the emergency department with elevated creatinine 14.45.  ?He is asymptomatic, not altered. No chest pain.  ?Most recent creatinine I can find is from September 2022. At that time it was 4.6.  ?No on dialysis.  ?Labs with no electrolyte derangements. EKG without peaked T-waves or arrhythmia.  ?Hgb 8.9 due to anemia of chronic disease. No evidence of bleed. No reported bleeding. ? ?At this time cannot identify cause of acute on chronic kidney disease. He has had no recent illnesses. No recent N/V/D or reason for dehydration. He is making good urine. No flank pain or concern for stone or obstruction. UA  without UTI.  ? ?Spoke with Dr. Posey Pronto with nephrology who recommends ongoing AKI workup with hospitalist admission. Will formally consult on patient tomorrow morning and reassess need for urgent dialysis. AT th

## 2022-01-19 NOTE — Assessment & Plan Note (Signed)
History of CKD stage IV with last creatinine in 06/2021 of 4.61.  Presents with creatinine of 14.45 with symptoms of uremia with nausea and vomiting.  Reports increased use of NSAIDs for rheumatoid arthritis which likely caused intrinsic kidney injury.  No changes in urinary output but will obtain renal ultrasound. ?-Obtain urine urea, creatinine and sodium ?-Give 500cc LR overnight and follow repeat creatinine ?-avoid nephrotoxic agent or contrasted study.  Will renally dose all medications. ?-Nephrology is aware and will see in consultation.  Patient likely will require hemodialysis. ?

## 2022-01-19 NOTE — ED Triage Notes (Signed)
Patient sent to ED by PCP for abnormal lab on test done today.Patient unsure of abnormal result. Patient is alert, oriented, ambulatory,and in no apparent distress at this time. Patient denies pain, denies changes in urination. ?

## 2022-01-19 NOTE — Assessment & Plan Note (Addendum)
Stable.  Will hold Lasix due to acute renal failure. ?

## 2022-01-19 NOTE — Assessment & Plan Note (Signed)
Continue antihypertensives once med rec is complete ?

## 2022-01-20 ENCOUNTER — Encounter (HOSPITAL_COMMUNITY): Payer: Self-pay | Admitting: Family Medicine

## 2022-01-20 ENCOUNTER — Other Ambulatory Visit: Payer: Self-pay

## 2022-01-20 DIAGNOSIS — M10372 Gout due to renal impairment, left ankle and foot: Secondary | ICD-10-CM | POA: Diagnosis not present

## 2022-01-20 DIAGNOSIS — N179 Acute kidney failure, unspecified: Secondary | ICD-10-CM | POA: Diagnosis not present

## 2022-01-20 DIAGNOSIS — N184 Chronic kidney disease, stage 4 (severe): Secondary | ICD-10-CM | POA: Diagnosis not present

## 2022-01-20 DIAGNOSIS — I10 Essential (primary) hypertension: Secondary | ICD-10-CM | POA: Diagnosis not present

## 2022-01-20 LAB — CBC
HCT: 22.8 % — ABNORMAL LOW (ref 39.0–52.0)
Hemoglobin: 7.6 g/dL — ABNORMAL LOW (ref 13.0–17.0)
MCH: 32.2 pg (ref 26.0–34.0)
MCHC: 33.3 g/dL (ref 30.0–36.0)
MCV: 96.6 fL (ref 80.0–100.0)
Platelets: 260 10*3/uL (ref 150–400)
RBC: 2.36 MIL/uL — ABNORMAL LOW (ref 4.22–5.81)
RDW: 15.1 % (ref 11.5–15.5)
WBC: 5.8 10*3/uL (ref 4.0–10.5)
nRBC: 0 % (ref 0.0–0.2)

## 2022-01-20 LAB — FERRITIN: Ferritin: 259 ng/mL (ref 24–336)

## 2022-01-20 LAB — BASIC METABOLIC PANEL
Anion gap: 12 (ref 5–15)
BUN: 93 mg/dL — ABNORMAL HIGH (ref 6–20)
CO2: 17 mmol/L — ABNORMAL LOW (ref 22–32)
Calcium: 6.7 mg/dL — ABNORMAL LOW (ref 8.9–10.3)
Chloride: 112 mmol/L — ABNORMAL HIGH (ref 98–111)
Creatinine, Ser: 13.71 mg/dL — ABNORMAL HIGH (ref 0.61–1.24)
GFR, Estimated: 4 mL/min — ABNORMAL LOW (ref >60)
Glucose, Bld: 104 mg/dL — ABNORMAL HIGH (ref 70–99)
Potassium: 4.2 mmol/L (ref 3.5–5.1)
Sodium: 141 mmol/L (ref 135–145)

## 2022-01-20 LAB — CREATININE, URINE, RANDOM: Creatinine, Urine: 81.74 mg/dL

## 2022-01-20 LAB — HIV ANTIBODY (ROUTINE TESTING W REFLEX): HIV Screen 4th Generation wRfx: NONREACTIVE

## 2022-01-20 LAB — IRON AND TIBC
Iron: 73 ug/dL (ref 45–182)
Saturation Ratios: 48 % — ABNORMAL HIGH (ref 17.9–39.5)
TIBC: 153 ug/dL — ABNORMAL LOW (ref 250–450)
UIBC: 80 ug/dL

## 2022-01-20 LAB — HEPATITIS B SURFACE ANTIGEN: Hepatitis B Surface Ag: NONREACTIVE

## 2022-01-20 LAB — HEPATITIS B SURFACE ANTIBODY,QUALITATIVE: Hep B S Ab: NONREACTIVE

## 2022-01-20 LAB — PHOSPHORUS: Phosphorus: 11.2 mg/dL — ABNORMAL HIGH (ref 2.5–4.6)

## 2022-01-20 LAB — SODIUM, URINE, RANDOM: Sodium, Ur: 67 mmol/L

## 2022-01-20 MED ORDER — SEVELAMER CARBONATE 800 MG PO TABS
800.0000 mg | ORAL_TABLET | Freq: Three times a day (TID) | ORAL | Status: DC
  Administered 2022-01-20 – 2022-01-25 (×10): 800 mg via ORAL
  Filled 2022-01-20 (×10): qty 1

## 2022-01-20 MED ORDER — HEPARIN SODIUM (PORCINE) 1000 UNIT/ML DIALYSIS
1000.0000 [IU] | INTRAMUSCULAR | Status: DC | PRN
  Filled 2022-01-20 (×2): qty 1

## 2022-01-20 MED ORDER — PANTOPRAZOLE SODIUM 40 MG PO TBEC
40.0000 mg | DELAYED_RELEASE_TABLET | Freq: Every day | ORAL | Status: DC
  Administered 2022-01-21 – 2022-01-25 (×5): 40 mg via ORAL
  Filled 2022-01-20 (×6): qty 1

## 2022-01-20 MED ORDER — SODIUM CHLORIDE 0.9 % IV SOLN: 100.0000 mL | INTRAVENOUS | Status: DC | PRN

## 2022-01-20 MED ORDER — CHLORHEXIDINE GLUCONATE CLOTH 2 % EX PADS
6.0000 | MEDICATED_PAD | Freq: Every day | CUTANEOUS | Status: DC
  Administered 2022-01-21 – 2022-01-24 (×4): 6 via TOPICAL

## 2022-01-20 MED ORDER — CEFAZOLIN SODIUM-DEXTROSE 2-4 GM/100ML-% IV SOLN
2.0000 g | INTRAVENOUS | Status: AC
  Administered 2022-01-21 (×2): 2 g via INTRAVENOUS
  Filled 2022-01-20: qty 100

## 2022-01-20 MED ORDER — ALTEPLASE 2 MG IJ SOLR
2.0000 mg | Freq: Once | INTRAMUSCULAR | Status: DC | PRN
  Filled 2022-01-20: qty 2

## 2022-01-20 MED ORDER — PENTAFLUOROPROP-TETRAFLUOROETH EX AERO: 1.0000 "application " | INHALATION_SPRAY | CUTANEOUS | Status: DC | PRN

## 2022-01-20 NOTE — Consult Note (Signed)
Nephrology Consult  ? ?Requesting provider: Dr. Flossie Peterson ?Service requesting consult: Internal Medicine ?Reason for consult: AKI ? ? ?Assessment/Recommendations: Cameron Peterson is a/an 56 y.o. male with a past medical historyCKD stage IV, left arthroscopic hypertension, chronic diastolic heart failure, hyperlipidemia who presents with abnormal kidney labs. ? ?ESRD ?Secondary Hyperparathyroidism ?Metabolic Acidosis ?Likely multifactorial from progression of chronic kidney disease secondary to underlying hypertension versus glomerular disease.  Has longstanding history of uncontrolled hypertension and was noted to have proteinuric kidney disease at least dating back to 2019.  All of this is further complicated by concomitant use of ARB and Aleve, predisposing him to development of intrinsic kidney disease as well.  Patient's creatinine of 13-14 is likely more subacute to chronic, given that he had similar levels on labs obtained by PCP about 3 weeks ago.  However, given that he is experiencing some mild uremic symptoms, he will need to be initiated on HD.  Plan for HD catheter placement today via IR.  We will start HD tomorrow. ?-Tunneled catheter placement today via IR ?-Initiate HD from tomorrow ?-Hold nephrotoxic medications (ARB, NSAIDs) in the setting of AKI ?-Continue to monitor daily Cr, dose meds for GFR ?-Monitor daily I/Os, daily weight  ?-Maintain MAP >65 for optimal renal perfusion ?-Avoid nephrotoxic medications including NSAIDs ?-Use synthetic opioids (Fentanyl/Dilaudid) if needed ?-FENa consistent with intrinsic disease (although patient on lasix) given no obstruction noted on renal U/S ?-f/u Hep B panel prior to HD ?-f/u phosphorus, PTH ? ?Anemia ?N/V, epigastric abdominal pain ?Patient with symptoms of nausea, vomiting, epigastric abdominal pain over the past few weeks. Patient has noted dark stools intermittently during this time as well. He does have underlying GERD, which is likely exacerbated by taking  prednisone chronically, and he has been taking aleve to control pain 2/2 RA. Although underlying kidney disease may be contributing to anemia, given his acute drop in Hgb along with the aforementioned, would recommend initiation of PPI and possible GI consult to rule out peptic ulcer disease.  ?-start PPI ?-recommend GI consult to r/o PUD ?-f/u FOBT, iron studies ?-hold NSAIDs ?-trend CBC ?-transfuse for hgb <7 ? ?HTN - per primary ? ?Chronic Diastolic HF - management per primary ?-Volume status will likely be optimized with HD ?-can restart lasix after initiation of HD as he is still producing urine ? ?Gout, RA - management per primary ?-on steroids ? ? ?Recommendations conveyed to primary service.  ? ? ?[name] ?[facility] ?01/20/2022 ?1:14 PM ? ? ?_____________________________________________________________________________________ ?CC: abnormal kidney labs ? ?History of Present Illness: Cameron Peterson is a/an 56 y.o. male with a past medical history of CKD stage IV, left arthroscopic hypertension, chronic diastolic heart failure, hyperlipidemia who presents with abnormal kidney labs. ? ?Cameron Peterson reports that he has not been feeling well for the past few weeks, which prompted him to go to his PCP, Cameron Peterson.  PCP noted abnormal kidney labs and advised patient to come into the ED.  ? ?States that he has been experiencing nausea and stomach pain during this time, with some episodes of NBNB vomiting.  States that he has been able to eat food but not as much as he used to.  Nausea initially started only when brushing teeth but progressed to random bouts of nausea.  He does note that he has had some dark stools intermittently over the past couple of weeks as well.  Denies any sick contacts, recent travel, fevers, chills, chest pain, shortness of breath.  States that he has  experienced acid reflux in the past and does have some symptoms intermittently from this.  Has tried Nexium but only  for a couple of days before stopping as he does not want to take too many medications. ? ?He does have a history of rheumatoid arthritis for which he takes low-dose prednisone chronically.  In the past he has taken naproxen as needed for pain relief.  He states that he ran out of his prednisone a little over a month ago and has been using naproxen for pain relief.  Does not remember the exact amount he is used but does state that he has taken about 3 to 5 tablets on days that his pain is more severe. ? ?He does report good urine output over the past few weeks, per his baseline.  He has been taking his Lasix regularly. ? ?Nephrology was consulted given that patient presented with a severe AKI.  Cameron Peterson notes that he was first told he had kidney disease back in 2013 and it has progressed since that time.  He does not have a history of diabetes but has had high blood pressure for a while now and takes several medications for this, including losartan.  His labs prior to this hospitalization suggest that he had already progressed to CKD stage IV as of 2022. ? ?Medications:  ?Current Facility-Administered Medications  ?Medication Dose Route Frequency Provider Last Rate Last Admin  ? [START ON 01/21/2022] Chlorhexidine Gluconate Cloth 2 % PADS 6 each  6 each Topical Q0600 Reesa Chew, MD      ? predniSONE (DELTASONE) tablet 10 mg  10 mg Oral Q breakfast Tu, Ching T, DO   10 mg at 01/20/22 0049  ? ?Current Outpatient Medications  ?Medication Sig Dispense Refill  ? carvedilol (COREG) 3.125 MG tablet Take 1 tablet (3.125 mg total) by mouth 2 (two) times daily with a meal. 60 tablet 1  ? furosemide (LASIX) 40 MG tablet Take 1 tablet (40 mg total) by mouth daily as needed for fluid or edema (3 pound weight gain/24 hrs, shortness of breath). 30 tablet 1  ? hydrALAZINE (APRESOLINE) 10 MG tablet Take 1 tablet (10 mg total) by mouth every 8 (eight) hours. (Patient taking differently: Take 10 mg by mouth 2 (two) times  daily.) 90 tablet 1  ? losartan (COZAAR) 50 MG tablet Take 50 mg by mouth 2 (two) times daily.    ? naproxen sodium (ALEVE) 220 MG tablet Take 660 mg by mouth daily as needed (pain).    ? pantoprazole (PROTONIX) 40 MG tablet Take 40 mg by mouth daily.    ? predniSONE (DELTASONE) 10 MG tablet Take 10-60 mg by mouth See admin instructions. 60mg  for days 1-3, 50mg  days 4-6, 40mg  days 7-9, 30mg  days 10-12, 20mg  days 13-15, 10mg  days 16-18. Pt is on day 4    ? sucralfate (CARAFATE) 1 g tablet Take 1 g by mouth 4 (four) times daily.    ? Tetrahydrozoline HCl (VISINE OP) Place 1 drop into both eyes daily as needed (dry eyes).    ? isosorbide mononitrate (IMDUR) 30 MG 24 hr tablet Take 0.5 tablets (15 mg total) by mouth daily. (Patient not taking: Reported on 01/20/2022) 30 tablet 1  ?  ? ?ALLERGIES ?Lisinopril and Shellfish allergy ? ?MEDICAL HISTORY ?Past Medical History:  ?Diagnosis Date  ? Acute diastolic CHF (congestive heart failure), NYHA class 1 ([place]) 02/04/2015  ? Hypertension   ?  ? ?SOCIAL HISTORY ?Social History  ? ?Socioeconomic History  ?  Marital status: Married  ?  Spouse name: Not on file  ? Number of children: Not on file  ? Years of education: Not on file  ? Highest education level: Not on file  ?Occupational History  ? Not on file  ?Tobacco Use  ? Smoking status: Never  ? Smokeless tobacco: Not on file  ?Substance and Sexual Activity  ? Alcohol use: Yes  ?  Comment: 1 pint of liquor a week  ? Drug use: No  ? Sexual activity: Yes  ?Other Topics Concern  ? Not on file  ?Social History Narrative  ? Not on file  ? ?Social Determinants of Health  ? ?Financial Resource Strain: Not on file  ?Food Insecurity: Not on file  ?Transportation Needs: Not on file  ?Physical Activity: Not on file  ?Stress: Not on file  ?Social Connections: Not on file  ?Intimate Partner Violence: Not on file  ?  ? ?FAMILY HISTORY ?Family History  ?Problem Relation Age of Onset  ? Diabetes Mellitus II Father   ?  ? ? ?Review of Systems: ?12  systems reviewed ?Otherwise as per HPI, all other systems reviewed and negative ? ?Physical Exam: ?Vitals:  ? 01/20/22 1111 01/20/22 1227  ?BP: (!) 147/89   ?Pulse: 77 77  ?Resp: 14 11  ?Temp:    ?SpO2:

## 2022-01-20 NOTE — Progress Notes (Signed)
IR was requested for Pc placement.  ? ?The procedure was tentatively scheduled for today, however, IR was not able to accommodate the procedure today due to scheduled outpatient procedures/urgent cases.  ? ?RN/MD notified, made npo at midnight.  ? ?The procedure is tentatively scheduled for tomorrow pending IR schedule.  ?Please call IR for questions and concerns.  ? ? ?Tera Mater PA-C ?01/20/2022 2:39 PM ? ? ? ?

## 2022-01-20 NOTE — ED Notes (Signed)
Consent for dialysis obtained and placed at bedside ?

## 2022-01-20 NOTE — Plan of Care (Signed)

## 2022-01-20 NOTE — Progress Notes (Signed)
?PROGRESS NOTE ? ? ? ?Cameron Peterson  XMI:[id number] DOB: 01/07/1966 DOA: 01/19/2022 ?PCP: Benito Mccreedy, MD  ? ? ?Brief Narrative:  ?56 year old with history of CKD stage IV with progressive disease, reported arthritis, gout, hypertension, chronic diastolic congestive heart failure, hyperlipidemia sent to the ER by primary care physician with abnormal creatinine. ?Patient does have a longstanding history of CKD, followed by nephrology at [facility]. ?He has been not feeling well for about a month with episodic abdominal discomfort may or may not related to the renal functions.  Denies any chest pain or shortness of breath.  Currently on tapering dose of prednisone for rheumatoid arthritis. ? ?Previous creatinine 4.6, creatinine 13 on 3/22 at PCP office, creatinine 14 at 4/11 PCP office. ? ?Assessment & Plan: ?  ?CKD stage V not on hemodialysis: Progressive hypertensive kidney disease. ?Currently no evidence of acute uremic symptoms.  Potassium is 4.2. ?Progressive kidney disease, now with ESRD, he will probably need hemodialysis. ?We will consult nephrology, he wants to establish care in [place] with [place] [facility]. ?Monitor closely. ?Likely will need permanent catheter and inpatient dialysis before transitioning to outpatient dialysis. ? ?Essential hypertension: Blood pressure stable.  On losartan.  Will hold until dialysis. ? ?Gout: Recent flareup treated with steroid taper.  Currently on prednisone 10 mg.  Continue. ? ?Anemia of chronic disease: May need work-up.  Will defer to nephrology. ? ? ?DVT prophylaxis: SCDs Start: 01/19/22 2331 ? ? ?Code Status: Full code ?Family Communication: Wife at the bedside ?Disposition Plan: Status is: Inpatient ?Remains inpatient appropriate because: Anticipating initiating dialysis. ?  ? ? ?Consultants:  ?Nephrology ? ?Procedures:  ?None ? ?Antimicrobials:  ?None ? ? ?Subjective: ?Patient seen and examined.  Still in the emergency room.  Wife at the bedside.  Denies any  nausea vomiting chest pain or palpitations. ?Patient tells me that he urinates normally. ?Patient had some abdominal discomfort and cramping that has improved now. ?He wants to establish care with nephrology in [place]. ? ?Objective: ?Vitals:  ? 01/20/22 0716 01/20/22 0900 01/20/22 1111 01/20/22 1113  ?BP: (!) 137/92 127/80 (!) 147/89   ?Pulse: 77 100 77   ?Resp: 14 13 14    ?Temp:      ?TempSrc:      ?SpO2: 98% 98% 100%   ?Weight:    105.1 kg  ?Height:    6\' 4"  (1.93 m)  ? ?No intake or output data in the 24 hours ending 01/20/22 1155 ?Filed Weights  ? 01/20/22 1113  ?Weight: 105.1 kg  ? ? ?Examination: ? ?General exam: Appears calm and comfortable  ?Respiratory system: Clear to auscultation. Respiratory effort normal. ?Cardiovascular system: S1 & S2 heard, RRR.  Trace bilateral pedal edema. ?Gastrointestinal system: Abdomen is nondistended, soft and nontender. No organomegaly or masses felt. Normal bowel sounds heard. ?Central nervous system: Alert and oriented. No focal neurological deficits. ?Extremities: Symmetric 5 x 5 power. ?Skin: No rashes, lesions or ulcers ?Psychiatry: Judgement and insight appear normal. Mood & affect appropriate.  ? ? ? ?Data Reviewed: I have personally reviewed following labs and imaging studies ? ?CBC: ?Recent Labs  ?Lab 01/19/22 ?1730 01/20/22 ?0412  ?WBC 8.9 5.8  ?NEUTROABS 5.8  --   ?HGB 8.9* 7.6*  ?HCT 26.9* 22.8*  ?MCV 96.4 96.6  ?PLT 294 260  ? ?Basic Metabolic Panel: ?Recent Labs  ?Lab 01/19/22 ?1730 01/20/22 ?0412  ?NA 143 141  ?K 4.2 4.2  ?CL 111 112*  ?CO2 15* 17*  ?GLUCOSE 117* 104*  ?BUN 95* 93*  ?CREATININE  14.45* 13.71*  ?CALCIUM 7.5* 6.7*  ? ?GFR: ?Estimated Creatinine Clearance: 8.1 mL/min (A) (by C-G formula based on SCr of 13.71 mg/dL (H)). ?Liver Function Tests: ?Recent Labs  ?Lab 01/19/22 ?1730  ?AST 12*  ?ALT 14  ?ALKPHOS 51  ?BILITOT 0.5  ?PROT 5.6*  ?ALBUMIN 1.6*  ? ?No results for input(s): LIPASE, AMYLASE in the last 168 hours. ?No results for input(s):  AMMONIA in the last 168 hours. ?Coagulation Profile: ?No results for input(s): INR, PROTIME in the last 168 hours. ?Cardiac Enzymes: ?No results for input(s): CKTOTAL, CKMB, CKMBINDEX, TROPONINI in the last 168 hours. ?BNP (last 3 results) ?No results for input(s): PROBNP in the last 8760 hours. ?HbA1C: ?No results for input(s): HGBA1C in the last 72 hours. ?CBG: ?No results for input(s): GLUCAP in the last 168 hours. ?Lipid Profile: ?No results for input(s): CHOL, HDL, LDLCALC, TRIG, CHOLHDL, LDLDIRECT in the last 72 hours. ?Thyroid Function Tests: ?No results for input(s): TSH, T4TOTAL, FREET4, T3FREE, THYROIDAB in the last 72 hours. ?Anemia Panel: ?No results for input(s): VITAMINB12, FOLATE, FERRITIN, TIBC, IRON, RETICCTPCT in the last 72 hours. ?Sepsis Labs: ?No results for input(s): PROCALCITON, LATICACIDVEN in the last 168 hours. ? ?No results found for this or any previous visit (from the past 240 hour(s)).  ? ? ? ? ? ?Radiology Studies: ?US RENAL ? ?Result Date: 01/20/2022 ?CLINICAL DATA:  Acute kidney injury EXAM: RENAL / URINARY TRACT ULTRASOUND COMPLETE COMPARISON:  Ultrasound 02/02/2015 FINDINGS: Right Kidney: Renal measurements: 10.7 x 4.5 x 4.2 cm = volume: 104.3 mL. Echogenic right kidney. No mass or hydronephrosis Left Kidney: Renal measurements: 10.7 x 5.4 x 5.2 cm = volume: 156.3 mL. Echogenic left kidney. No hydronephrosis. Cyst at the mid to upper pole measuring 2 cm, no follow-up imaging recommended Bladder: Appears normal for degree of bladder distention. Other: None. IMPRESSION: 1. Echogenic kidneys bilaterally consistent with medical renal disease. 2. Negative for hydronephrosis Electronically Signed   By: Donavan Foil M.D.   On: 01/20/2022 00:06   ? ? ? ? ? ?Scheduled Meds: ? predniSONE  10 mg Oral Q breakfast  ? ?Continuous Infusions: ? ? LOS: 1 day  ? ? ?Time spent: 35 minutes ? ? ? ?Barb Merino, MD ?[facility] ?Pager [phone]  ?

## 2022-01-20 NOTE — Plan of Care (Signed)

## 2022-01-20 NOTE — ED Notes (Signed)
Report given to Emily, RN.

## 2022-01-20 NOTE — Consult Note (Signed)
? ?Chief Complaint: ?Patient was seen in consultation today for acute on chronic renal failure at the request of Dr. Joylene Grapes ? ?Supervising Physician: Markus Daft ? ?Patient Status: [facility] - ED ? ?History of Present Illness: ?Cameron Peterson is a 56 y.o. male with medical history significant of CKD stage 4, Rheumatoid arthritis, gout, HTN, chronic diastolic HF, pre-DM and HLD who presents with worsening renal function. ?  ?He presented to PCP yesterday for routine checkup and blood work and was found to have elevated creatinine.  Repeat creatinine here of 14.45 with anion gap of 17.  Last creatinine in 06/2021 was at 4.61.  He notes nausea with vomiting in the past 2 days with epigastric abdominal pain.  Denies any flank pain.  Reports normal urine output, has been taking Lasix.  He recently ran out of prednisone for his rheumatoid arthritis for a month and a half and has been taking naproxen for his pain.  Reports taking 3 to 5 pills at a time during his flareup but not daily. ?  ?IR consulted for placement of tunneled central catheter for hemodialysis. ? ?Past Medical History:  ?Diagnosis Date  ? Acute diastolic CHF (congestive heart failure), NYHA class 1 ([place]) 02/04/2015  ? Hypertension   ? ? ?History reviewed. No pertinent surgical history. ? ?Allergies: ?Lisinopril and Shellfish allergy ? ?Medications: ?Prior to Admission medications   ?Medication Sig Start Date End Date Taking? Authorizing Provider  ?carvedilol (COREG) 3.125 MG tablet Take 1 tablet (3.125 mg total) by mouth 2 (two) times daily with a meal. 02/04/15  Yes Rama, Venetia Maxon, MD  ?furosemide (LASIX) 40 MG tablet Take 1 tablet (40 mg total) by mouth daily as needed for fluid or edema (3 pound weight gain/24 hrs, shortness of breath). 02/04/15  Yes Rama, Venetia Maxon, MD  ?hydrALAZINE (APRESOLINE) 10 MG tablet Take 1 tablet (10 mg total) by mouth every 8 (eight) hours. ?Patient taking differently: Take 10 mg by mouth 2 (two) times daily. 02/04/15  Yes  Rama, Venetia Maxon, MD  ?losartan (COZAAR) 50 MG tablet Take 50 mg by mouth 2 (two) times daily.   Yes [provider]  ?naproxen sodium (ALEVE) 220 MG tablet Take 660 mg by mouth daily as needed (pain).   Yes [provider]  ?pantoprazole (PROTONIX) 40 MG tablet Take 40 mg by mouth daily. 12/29/21  Yes [provider]  ?predniSONE (DELTASONE) 10 MG tablet Take 10-60 mg by mouth See admin instructions. 60mg  for days 1-3, 50mg  days 4-6, 40mg  days 7-9, 30mg  days 10-12, 20mg  days 13-15, 10mg  days 16-18. Pt is on day 4   Yes [provider]  ?sucralfate (CARAFATE) 1 g tablet Take 1 g by mouth 4 (four) times daily. 12/29/21  Yes [provider]  ?Tetrahydrozoline HCl (VISINE OP) Place 1 drop into both eyes daily as needed (dry eyes).   Yes [provider]  ?isosorbide mononitrate (IMDUR) 30 MG 24 hr tablet Take 0.5 tablets (15 mg total) by mouth daily. ?Patient not taking: Reported on 01/20/2022 02/04/15   Rama, Venetia Maxon, MD  ?  ? ?Family History  ?Problem Relation Age of Onset  ? Diabetes Mellitus II Father   ? ? ?Social History  ? ?Socioeconomic History  ? Marital status: Married  ?  Spouse name: Not on file  ? Number of children: Not on file  ? Years of education: Not on file  ? Highest education level: Not on file  ?Occupational History  ? Not on file  ?  Tobacco Use  ? Smoking status: Never  ? Smokeless tobacco: Not on file  ?Substance and Sexual Activity  ? Alcohol use: Yes  ?  Comment: 1 pint of liquor a week  ? Drug use: No  ? Sexual activity: Yes  ?Other Topics Concern  ? Not on file  ?Social History Narrative  ? Not on file  ? ?Social Determinants of Health  ? ?Financial Resource Strain: Not on file  ?Food Insecurity: Not on file  ?Transportation Needs: Not on file  ?Physical Activity: Not on file  ?Stress: Not on file  ?Social Connections: Not on file  ? ? ?Review of Systems: A 12 point ROS discussed and pertinent positives are indicated in the HPI above.  All  other systems are negative. ? ? ?Vital Signs: ?BP (!) 147/89   Pulse 77   Temp 97.8 ?F (36.6 ?C) (Oral)   Resp 14   Ht 6\' 4"  (1.93 m)   Wt 231 lb 11.3 oz (105.1 kg)   SpO2 100%   BMI 28.20 kg/m?  ? ?Physical Exam ?Vitals reviewed.  ?Constitutional:   ?   General: He is not in acute distress. ?HENT:  ?   Head: Normocephalic and atraumatic.  ?   Mouth/Throat:  ?   Mouth: Mucous membranes are moist.  ?   Pharynx: Oropharynx is clear.  ?Eyes:  ?   Extraocular Movements: Extraocular movements intact.  ?   Conjunctiva/sclera: Conjunctivae normal.  ?Cardiovascular:  ?   Rate and Rhythm: Normal rate.  ?   Pulses: Normal pulses.  ?Pulmonary:  ?   Effort: Pulmonary effort is normal.  ?   Breath sounds: Normal breath sounds.  ?Abdominal:  ?   General: Abdomen is flat.  ?   Palpations: Abdomen is soft.  ?Skin: ?   General: Skin is warm and dry.  ?Neurological:  ?   General: No focal deficit present.  ?   Mental Status: He is alert.  ?Psychiatric:     ?   Mood and Affect: Mood normal.     ?   Judgment: Judgment normal.  ? ? ?Imaging: ?US RENAL ? ?Result Date: 01/20/2022 ?CLINICAL DATA:  Acute kidney injury EXAM: RENAL / URINARY TRACT ULTRASOUND COMPLETE COMPARISON:  Ultrasound 02/02/2015 FINDINGS: Right Kidney: Renal measurements: 10.7 x 4.5 x 4.2 cm = volume: 104.3 mL. Echogenic right kidney. No mass or hydronephrosis Left Kidney: Renal measurements: 10.7 x 5.4 x 5.2 cm = volume: 156.3 mL. Echogenic left kidney. No hydronephrosis. Cyst at the mid to upper pole measuring 2 cm, no follow-up imaging recommended Bladder: Appears normal for degree of bladder distention. Other: None. IMPRESSION: 1. Echogenic kidneys bilaterally consistent with medical renal disease. 2. Negative for hydronephrosis Electronically Signed   By: Donavan Foil M.D.   On: 01/20/2022 00:06   ? ?Labs: ? ?CBC: ?Recent Labs  ?  01/19/22 ?1730 01/20/22 ?0412  ?WBC 8.9 5.8  ?HGB 8.9* 7.6*  ?HCT 26.9* 22.8*  ?PLT 294 260  ? ? ?COAGS: ?No results for  input(s): INR, APTT in the last 8760 hours. ? ?BMP: ?Recent Labs  ?  01/19/22 ?1730 01/20/22 ?0412  ?NA 143 141  ?K 4.2 4.2  ?CL 111 112*  ?CO2 15* 17*  ?GLUCOSE 117* 104*  ?BUN 95* 93*  ?CALCIUM 7.5* 6.7*  ?CREATININE 14.45* 13.71*  ?GFRNONAA 4* 4*  ? ? ?LIVER FUNCTION TESTS: ?Recent Labs  ?  01/19/22 ?1730  ?BILITOT 0.5  ?AST 12*  ?ALT 14  ?ALKPHOS 51  ?  PROT 5.6*  ?ALBUMIN 1.6*  ? ? ?TUMOR MARKERS: ?No results for input(s): AFPTM, CEA, CA199, CHROMGRNA in the last 8760 hours. ? ?Assessment and Plan: ? ?Acute on chronic renal failure ?--in need of dialysis ?--will plan to place tunneled HD cath today ?--last ate at 8am, can plan for after 2pm ? ?Risks and benefits discussed with the patient including, but not limited to bleeding, infection, vascular injury, pneumothorax which may require chest tube placement, air embolism or even death ? ?All of the patient's questions were answered, patient is agreeable to proceed. ?Consent signed and in chart.  ? ?Thank you for this interesting consult.  I greatly enjoyed meeting Bladyn Tipps and look forward to participating in their care.  A copy of this report was sent to the requesting provider on this date. ? ?Electronically Signed: ?Pasty Spillers, PA ?01/20/2022, 11:56 AM ? ? ?I spent a total of 20 Minutes in face to face in clinical consultation, greater than 50% of which was counseling/coordinating care for dialysis catheter placement. ? ?

## 2022-01-20 NOTE — ED Notes (Signed)
Pt advised of need for urine specimen, urinal provided per request ?

## 2022-01-21 ENCOUNTER — Inpatient Hospital Stay (HOSPITAL_COMMUNITY): Payer: 59

## 2022-01-21 DIAGNOSIS — N184 Chronic kidney disease, stage 4 (severe): Secondary | ICD-10-CM | POA: Diagnosis not present

## 2022-01-21 DIAGNOSIS — M10372 Gout due to renal impairment, left ankle and foot: Secondary | ICD-10-CM | POA: Diagnosis not present

## 2022-01-21 DIAGNOSIS — N179 Acute kidney failure, unspecified: Secondary | ICD-10-CM | POA: Diagnosis not present

## 2022-01-21 DIAGNOSIS — I10 Essential (primary) hypertension: Secondary | ICD-10-CM | POA: Diagnosis not present

## 2022-01-21 HISTORY — PX: IR US GUIDE VASC ACCESS RIGHT: IMG2390

## 2022-01-21 HISTORY — PX: IR FLUORO GUIDE CV LINE RIGHT: IMG2283

## 2022-01-21 LAB — PARATHYROID HORMONE, INTACT (NO CA): PTH: 698 pg/mL — ABNORMAL HIGH (ref 15–65)

## 2022-01-21 LAB — CBC
HCT: 24.7 % — ABNORMAL LOW (ref 39.0–52.0)
Hemoglobin: 8.3 g/dL — ABNORMAL LOW (ref 13.0–17.0)
MCH: 31.8 pg (ref 26.0–34.0)
MCHC: 33.6 g/dL (ref 30.0–36.0)
MCV: 94.6 fL (ref 80.0–100.0)
Platelets: 278 10*3/uL (ref 150–400)
RBC: 2.61 MIL/uL — ABNORMAL LOW (ref 4.22–5.81)
RDW: 15.1 % (ref 11.5–15.5)
WBC: 7.1 10*3/uL (ref 4.0–10.5)
nRBC: 0 % (ref 0.0–0.2)

## 2022-01-21 LAB — RENAL FUNCTION PANEL
Albumin: <1.5 g/dL — ABNORMAL LOW (ref 3.5–5.0)
Anion gap: 11 (ref 5–15)
BUN: 102 mg/dL — ABNORMAL HIGH (ref 6–20)
CO2: 18 mmol/L — ABNORMAL LOW (ref 22–32)
Calcium: 6.8 mg/dL — ABNORMAL LOW (ref 8.9–10.3)
Chloride: 114 mmol/L — ABNORMAL HIGH (ref 98–111)
Creatinine, Ser: 14.28 mg/dL — ABNORMAL HIGH (ref 0.61–1.24)
GFR, Estimated: 4 mL/min — ABNORMAL LOW (ref >60)
Glucose, Bld: 94 mg/dL (ref 70–99)
Phosphorus: 11.2 mg/dL — ABNORMAL HIGH (ref 2.5–4.6)
Potassium: 4 mmol/L (ref 3.5–5.1)
Sodium: 143 mmol/L (ref 135–145)

## 2022-01-21 MED ORDER — MIDAZOLAM HCL 2 MG/2ML IJ SOLN
INTRAMUSCULAR | Status: AC
  Filled 2022-01-21: qty 2

## 2022-01-21 MED ORDER — LIDOCAINE HCL (PF) 1 % IJ SOLN
INTRAMUSCULAR | Status: AC | PRN
  Administered 2022-01-21: 20 mL

## 2022-01-21 MED ORDER — MIDAZOLAM HCL 2 MG/2ML IJ SOLN
INTRAMUSCULAR | Status: AC | PRN
  Administered 2022-01-21: 1 mg via INTRAVENOUS

## 2022-01-21 MED ORDER — SODIUM CHLORIDE 0.9 % IV SOLN: INTRAVENOUS | Status: DC | PRN

## 2022-01-21 MED ORDER — ACETAMINOPHEN 325 MG PO TABS
650.0000 mg | ORAL_TABLET | Freq: Four times a day (QID) | ORAL | Status: DC | PRN
  Administered 2022-01-24: 650 mg via ORAL
  Filled 2022-01-21: qty 2

## 2022-01-21 MED ORDER — LIDOCAINE HCL 1 % IJ SOLN
INTRAMUSCULAR | Status: AC
  Filled 2022-01-21: qty 20

## 2022-01-21 MED ORDER — FENTANYL CITRATE (PF) 100 MCG/2ML IJ SOLN
INTRAMUSCULAR | Status: AC | PRN
  Administered 2022-01-21: 50 ug via INTRAVENOUS

## 2022-01-21 MED ORDER — HEPARIN SODIUM (PORCINE) 1000 UNIT/ML IJ SOLN
INTRAMUSCULAR | Status: AC
  Administered 2022-01-21: 3800 [IU]
  Filled 2022-01-21: qty 10

## 2022-01-21 MED ORDER — FENTANYL CITRATE (PF) 100 MCG/2ML IJ SOLN
INTRAMUSCULAR | Status: AC
  Filled 2022-01-21: qty 2

## 2022-01-21 NOTE — Progress Notes (Signed)
Cefazolin (Ancef) may ned to be rescheduled for procedure tomorrow, please advise.  ?

## 2022-01-21 NOTE — Progress Notes (Signed)
Patient had 12 beats of vtach. VSS, patient sleeping and asymptomatic. Provider notified. ?

## 2022-01-21 NOTE — Progress Notes (Signed)
?  [facility] ?Progress Note  ? ?Assessment/ Plan:   ?1. ESRD - TDC placement with IR today. Plan for HD later on today for a short session and repeat again tomorrow. Will likely not proceed with AVF creation this admission as patient is interested in PD. Will try to get him to the transitional care unit where he can decide between HD and PD. Negative for Hep B, but he is not immunized.  ? ?2. Anemia, N/V, epigastric pain: Iron levels normal. FOBT pending. On PO PPI. Added CBC to recheck Hgb this AM. Consider GI consult if continues to trend down. Hold NSAIDs. Transfuse if Hgb <7. ? ?3. CKD-MBD: F/u PTH level. Consider addition of vitamin D analog and phosphate binder as outpatient if needed. ? ?4. Hypertension: per primary ? ?5. Chronic diastolic HF: management per primary. Will optimize volume status with HD. Consider restarting lasix after initiation of HD as he is still producing urine. ? ?6. Gout, RA: management per primary. On steroids.  ? ?Subjective:   ? ?Evaluated patient at bedside as transport was present to take him to IR for HD cath placement. ? ?He reports feeling well this morning. Had trouble falling asleep, but otherwise no acute changes. Abdominal pain not too bad this morning and no reported episodes of vomiting overnight.  ? ?Objective:   ?BP (!) 149/107 (BP Location: Right Arm)   Pulse 70   Temp 98.6 ?F (37 ?C) (Oral)   Resp 15   Ht 6' 3.98" (1.93 m)   Wt 120 kg   SpO2 99%   BMI 32.21 kg/m?  ? ?Physical Exam: ?Gen: well-appearing obese middle aged male, lying in bed, NAD. ?CVS: normal rate and regular rhythm, no m/r/g. ?Resp: CTABL, normal work of breathing on RA. ?Abd: soft, nontender, nondistended, normoactive bowel sounds. ?Ext: trace b/l LE edema ?Skin: warm and dry ?Neuro: AAOx3, no asterixis on exam, no focal deficits noted. ? ?Labs: ?BMET ?Recent Labs  ?Lab 01/19/22 ?1730 01/20/22 ?0412 01/21/22 ?0710  ?NA 143 141 143  ?K 4.2 4.2 4.0  ?CL 111 112* 114*  ?CO2 15* 17*  18*  ?GLUCOSE 117* 104* 94  ?BUN 95* 93* 102*  ?CREATININE 14.45* 13.71* 14.28*  ?CALCIUM 7.5* 6.7* 6.8*  ?PHOS  --  11.2* 11.2*  ? ?CBC ?Recent Labs  ?Lab 01/19/22 ?1730 01/20/22 ?0412  ?WBC 8.9 5.8  ?NEUTROABS 5.8  --   ?HGB 8.9* 7.6*  ?HCT 26.9* 22.8*  ?MCV 96.4 96.6  ?PLT 294 260  ? ? ?  ?Medications:   ? ? Chlorhexidine Gluconate Cloth  6 each Topical Q0600  ? fentaNYL      ? lidocaine      ? midazolam      ? pantoprazole  40 mg Oral Daily  ? predniSONE  10 mg Oral Q breakfast  ? sevelamer carbonate  800 mg Oral TID WC  ? ? ? ?[name], MD ?01/21/2022, 9:07 AM  ? ?

## 2022-01-21 NOTE — Progress Notes (Addendum)
removed no fluid as ordered. no complaints no complications urinated approx 145ml light ydellow urine during treatment. pre bp 151/84 post bp 153/81 pre weight 118.2kg post weight 118.0kg.  catheter ran well packed with heparin clamped and capped ?

## 2022-01-21 NOTE — Procedures (Signed)
Vascular and Interventional Radiology Procedure Note ? ?Patient: Cameron Peterson ?DOB: 1965/12/01 ?Medical Record Number: [id number] ?Note Date/Time: 01/21/22 9:28 AM  ? ?Performing Physician: Michaelle Birks, MD ?Assistant(s): None ? ?Diagnosis: ESRD requiring Hemodialysis ? ?Procedure: TUNNELED HEMODIALYSIS CATHETER PLACEMENT ? ?Anesthesia: Conscious Sedation ?Complications: None ?Estimated Blood Loss: Minimal ?Specimens:  None ? ?Findings:  ?Successful placement of right-sided, 23 cm (tip-to-cuff), tunneled hemodialysis catheter with the tip of the catheter in the proximal right atrium. ? ?Plan: Catheter ready for use. ? ?See detailed procedure note with images in PACS. ?The patient tolerated the procedure well without incident or complication and was returned to Floor Bed in stable condition.  ? ? ?Michaelle Birks, MD ?[facility] ?[facility] ? ? ?Pager. [phone] ?Clinic. [phone]  ?

## 2022-01-21 NOTE — Progress Notes (Signed)
?PROGRESS NOTE ? ? ? ?Cameron Peterson  FAO:[id number] DOB: [birth year] DOA: 01/19/2022 ?PCP: Benito Mccreedy, MD  ? ? ?Brief Narrative:  ?56 year old with history of CKD stage IV with progressive disease, reported arthritis, gout, hypertension, chronic diastolic congestive heart failure, hyperlipidemia sent to the ER by primary care physician with abnormal creatinine. ?Patient does have a longstanding history of CKD, followed by nephrology at [facility]. ?He has been not feeling well for about a month with episodic abdominal discomfort may or may not related to the renal functions.  Denies any chest pain or shortness of breath.  Currently on tapering dose of prednisone for rheumatoid arthritis. ? ?Previous creatinine 4.6, creatinine 13 on 3/22 at PCP office, creatinine 14 at 4/11 PCP office. ? ?Assessment & Plan: ?  ?CKD stage V not on hemodialysis: Progressive hypertensive kidney disease.  Now ESRD. ?Currently evidence of mild uremic symptoms with poor appetite and abdominal cramping. ?Permacath placed 4/13 by IR.  Starting on hemodialysis today. ? ?Essential hypertension: Blood pressure stable.  On losartan.  Will hold until dialysis. ? ?Gout: Recent flareup treated with steroid taper.  Currently on prednisone 10 mg.  Continue. ? ?Anemia of chronic disease: To be replaced with dialysis. ? ? ?DVT prophylaxis: SCDs Start: 01/19/22 2331 ? ? ?Code Status: Full code ?Family Communication: None. ?Disposition Plan: Status is: Inpatient ?Remains inpatient appropriate because: Anticipating initiating dialysis. ?  ? ? ?Consultants:  ?Nephrology ? ?Procedures:  ?None ? ?Antimicrobials:  ?None ? ? ?Subjective: ? ?Seen and examined.  Came back from catheter placement.  No overnight events.  Denies any chest pain palpitations or shortness of breath. ?Overnight with some NSVT in the telemetry, electrolytes stable. ? ?Objective: ?Vitals:  ? 01/21/22 0920 01/21/22 0928 01/21/22 1000 01/21/22 1030  ?BP: (!) 143/97 (!) 156/90 (!)  154/97 (!) 142/91  ?Pulse: 79 67    ?Resp: 15 19    ?Temp:  97.8 ?F (36.6 ?C)    ?TempSrc:  Oral    ?SpO2: 100% 96%    ?Weight:      ?Height:      ? ? ?Intake/Output Summary (Last 24 hours) at 01/21/2022 1041 ?Last data filed at 01/21/2022 1030 ?Gross per 24 hour  ?Intake 750 ml  ?Output 1550 ml  ?Net -800 ml  ? ?Filed Weights  ? 01/20/22 1113 01/20/22 1700 01/21/22 0043  ?Weight: 105.1 kg 120.3 kg 120 kg  ? ? ?Examination: ? ?General: Looks comfortable.  On room air. ?Cardiovascular: S1-S2 normal.  No rubs or gallops. ?Respiratory: Bilateral clear.  No added sounds. ?Right IJ permacath ?Gastrointestinal: Soft.  Nontender.  Bowel sound present. ?Ext: Trace bilateral pedal edema.  No cyanosis or clubbing. ?Neuro: No deficits. ? ? ? ? ? ?Data Reviewed: I have personally reviewed following labs and imaging studies ? ?CBC: ?Recent Labs  ?Lab 01/19/22 ?1730 01/20/22 ?0412 01/21/22 ?0710  ?WBC 8.9 5.8 7.1  ?NEUTROABS 5.8  --   --   ?HGB 8.9* 7.6* 8.3*  ?HCT 26.9* 22.8* 24.7*  ?MCV 96.4 96.6 94.6  ?PLT 294 260 278  ? ?Basic Metabolic Panel: ?Recent Labs  ?Lab 01/19/22 ?1730 01/20/22 ?0412 01/21/22 ?0710  ?NA 143 141 143  ?K 4.2 4.2 4.0  ?CL 111 112* 114*  ?CO2 15* 17* 18*  ?GLUCOSE 117* 104* 94  ?BUN 95* 93* 102*  ?CREATININE 14.45* 13.71* 14.28*  ?CALCIUM 7.5* 6.7* 6.8*  ?PHOS  --  11.2* 11.2*  ? ?GFR: ?Estimated Creatinine Clearance: 8.3 mL/min (A) (by C-G formula based on SCr of  14.28 mg/dL (H)). ?Liver Function Tests: ?Recent Labs  ?Lab 01/19/22 ?1730 01/21/22 ?0710  ?AST 12*  --   ?ALT 14  --   ?ALKPHOS 51  --   ?BILITOT 0.5  --   ?PROT 5.6*  --   ?ALBUMIN 1.6* <1.5*  ? ?No results for input(s): LIPASE, AMYLASE in the last 168 hours. ?No results for input(s): AMMONIA in the last 168 hours. ?Coagulation Profile: ?No results for input(s): INR, PROTIME in the last 168 hours. ?Cardiac Enzymes: ?No results for input(s): CKTOTAL, CKMB, CKMBINDEX, TROPONINI in the last 168 hours. ?BNP (last 3 results) ?No results for input(s):  PROBNP in the last 8760 hours. ?HbA1C: ?No results for input(s): HGBA1C in the last 72 hours. ?CBG: ?No results for input(s): GLUCAP in the last 168 hours. ?Lipid Profile: ?No results for input(s): CHOL, HDL, LDLCALC, TRIG, CHOLHDL, LDLDIRECT in the last 72 hours. ?Thyroid Function Tests: ?No results for input(s): TSH, T4TOTAL, FREET4, T3FREE, THYROIDAB in the last 72 hours. ?Anemia Panel: ?Recent Labs  ?  01/20/22 ?0412  ?FERRITIN 259  ?TIBC 153*  ?IRON 73  ? ?Sepsis Labs: ?No results for input(s): PROCALCITON, LATICACIDVEN in the last 168 hours. ? ?No results found for this or any previous visit (from the past 240 hour(s)).  ? ? ? ? ? ?Radiology Studies: ?US RENAL ? ?Result Date: 01/20/2022 ?CLINICAL DATA:  Acute kidney injury EXAM: RENAL / URINARY TRACT ULTRASOUND COMPLETE COMPARISON:  Ultrasound 02/02/2015 FINDINGS: Right Kidney: Renal measurements: 10.7 x 4.5 x 4.2 cm = volume: 104.3 mL. Echogenic right kidney. No mass or hydronephrosis Left Kidney: Renal measurements: 10.7 x 5.4 x 5.2 cm = volume: 156.3 mL. Echogenic left kidney. No hydronephrosis. Cyst at the mid to upper pole measuring 2 cm, no follow-up imaging recommended Bladder: Appears normal for degree of bladder distention. Other: None. IMPRESSION: 1. Echogenic kidneys bilaterally consistent with medical renal disease. 2. Negative for hydronephrosis Electronically Signed   By: Donavan Foil M.D.   On: 01/20/2022 00:06   ? ? ? ? ? ?Scheduled Meds: ? Chlorhexidine Gluconate Cloth  6 each Topical Q0600  ? fentaNYL      ? lidocaine      ? midazolam      ? pantoprazole  40 mg Oral Daily  ? predniSONE  10 mg Oral Q breakfast  ? sevelamer carbonate  800 mg Oral TID WC  ? ?Continuous Infusions: ? sodium chloride    ? sodium chloride    ? sodium chloride 10 mL/hr at 01/21/22 0022  ? ? ? LOS: 2 days  ? ? ?Time spent: 35 minutes ? ? ? ?Barb Merino, MD ?[facility] ?Pager [phone]  ?

## 2022-01-21 NOTE — Progress Notes (Signed)
Requested to see pt for out-pt HD needs. Met with pt at bedside. Introduced self and explained role. Discussed out-pt HD options- TCU 4x's a week with education regarding possible home therapy in the future vs in-center 3x's a week. Answered questions regarding both options. At this time, pt is unsure which route he would like to proceed with and needs to discuss with wife. Pt unsure when wife will arrive at hospital today. Will f/u with pt at a later time to see if he has made a decision regarding HD option at d/c. Pt drives and plans to drive self to appts. Will assist as needed.  ? ?Melven Sartorius ?Renal Navigator ?[phone] ?

## 2022-01-22 DIAGNOSIS — N184 Chronic kidney disease, stage 4 (severe): Secondary | ICD-10-CM | POA: Diagnosis not present

## 2022-01-22 DIAGNOSIS — N179 Acute kidney failure, unspecified: Secondary | ICD-10-CM | POA: Diagnosis not present

## 2022-01-22 LAB — UREA NITROGEN, URINE: Urea Nitrogen, Ur: 303 mg/dL

## 2022-01-22 LAB — RENAL FUNCTION PANEL
Albumin: <1.5 g/dL — ABNORMAL LOW (ref 3.5–5.0)
Anion gap: 9 (ref 5–15)
BUN: 73 mg/dL — ABNORMAL HIGH (ref 6–20)
CO2: 21 mmol/L — ABNORMAL LOW (ref 22–32)
Calcium: 6.8 mg/dL — ABNORMAL LOW (ref 8.9–10.3)
Chloride: 109 mmol/L (ref 98–111)
Creatinine, Ser: 10.92 mg/dL — ABNORMAL HIGH (ref 0.61–1.24)
GFR, Estimated: 5 mL/min — ABNORMAL LOW (ref >60)
Glucose, Bld: 105 mg/dL — ABNORMAL HIGH (ref 70–99)
Phosphorus: 8.1 mg/dL — ABNORMAL HIGH (ref 2.5–4.6)
Potassium: 4 mmol/L (ref 3.5–5.1)
Sodium: 139 mmol/L (ref 135–145)

## 2022-01-22 LAB — MAGNESIUM: Magnesium: 1.8 mg/dL (ref 1.7–2.4)

## 2022-01-22 LAB — HEPATITIS B SURFACE ANTIBODY, QUANTITATIVE: Hep B S AB Quant (Post): <3.1 m[IU]/mL — ABNORMAL LOW (ref >9.9)

## 2022-01-22 MED ORDER — KIDNEY FAILURE BOOK: Freq: Once | Status: AC

## 2022-01-22 MED ORDER — HEPARIN SODIUM (PORCINE) 1000 UNIT/ML IJ SOLN
INTRAMUSCULAR | Status: AC
  Filled 2022-01-22: qty 4

## 2022-01-22 MED ORDER — DARBEPOETIN ALFA 100 MCG/0.5ML IJ SOSY: 100.0000 ug | PREFILLED_SYRINGE | INTRAMUSCULAR | Status: DC

## 2022-01-22 MED ORDER — DARBEPOETIN ALFA 60 MCG/0.3ML IJ SOSY
60.0000 ug | PREFILLED_SYRINGE | INTRAMUSCULAR | Status: DC
  Administered 2022-01-22: 60 ug via INTRAVENOUS
  Filled 2022-01-22: qty 0.3

## 2022-01-22 MED ORDER — CALCITRIOL 0.5 MCG PO CAPS
0.5000 ug | ORAL_CAPSULE | ORAL | Status: DC
  Administered 2022-01-23: 0.5 ug via ORAL
  Filled 2022-01-22 (×2): qty 1

## 2022-01-22 NOTE — Progress Notes (Signed)
Met with pt at bedside while pt receiving HD. Pt informs navigator that he and pt's wife would like to proceed with TCU (4x's a week) referral. Referral submitted to [facility] admissions this morning for review. Will follow and assist.  ? ? ?Melven Sartorius ?Renal Navigator ?[phone] ?

## 2022-01-22 NOTE — Progress Notes (Signed)
?   [facility] ?Progress Note  ? ?Assessment/ Plan:   ?1. ESRD - Tolerated HD well yesterday, second session today. Will be transferring to transitional care unit from here so that he can decide between HD and PD going forward. Will hold off on AVF creation this admission.  ? ?2. Anemia: Iron levels normal and hemoglobin low but stable. Likely 2/2 underlying renal disease, added aranesp to aim for goal Hgb 10-11. Transfuse if Hgb <7. ? ?3. CKD-MBD: PTH significantly elevated, low calcium, low vitamin D. Will start calcitriol on HD days and increased calcium bath during HD session tomorrow. ? ?4. Hyperphosphatemia: Added sevelamer, phosphorus improved today. ? ?4. Hypertension: per primary ? ?5. Chronic diastolic HF: management per primary. Will optimize volume status with HD. ? ? ?Subjective:   ? ?Evaluated patient during dialysis session. He reports doing well today, some mild pain around HD cath site. Stomach pain has gone away and has not had any nausea or vomiting. Thought that dialysis was going to be worse than what it is. No complaints or concerns at this time.  ?  ? ?Objective:   ?BP (!) 152/81 (BP Location: Right Arm)   Pulse 78   Temp 98 ?F (36.7 ?C) (Oral)   Resp 19   Ht 6' 3.98" (1.93 m)   Wt 117.9 kg   SpO2 100%   BMI 31.65 kg/m?  ? ?Physical Exam: ?Gen: well-appearing obese middle aged male, laying in bed, undergoing HD, NAD. ?CV: normal rate and regular rhythm, no m/r/g. ?Pulm: CTABL, normal work of breathing on RA. ?Abd: soft, nontender, nondistended, normoactive bowel sounds. ?Extremities: trace BLE edema ?Neuro: AAOx3, no focal deficits noted. ? ? ?Labs: ?BMET ?Recent Labs  ?Lab 01/19/22 ?1730 01/20/22 ?0412 01/21/22 ?0710 01/22/22 ?0340  ?NA 143 141 143 139  ?K 4.2 4.2 4.0 4.0  ?CL 111 112* 114* 109  ?CO2 15* 17* 18* 21*  ?GLUCOSE 117* 104* 94 105*  ?BUN 95* 93* 102* 73*  ?CREATININE 14.45* 13.71* 14.28* 10.92*  ?CALCIUM 7.5* 6.7* 6.8* 6.8*  ?PHOS  --  11.2* 11.2* 8.1*   ? ?CBC ?Recent Labs  ?Lab 01/19/22 ?1730 01/20/22 ?0412 01/21/22 ?0710  ?WBC 8.9 5.8 7.1  ?NEUTROABS 5.8  --   --   ?HGB 8.9* 7.6* 8.3*  ?HCT 26.9* 22.8* 24.7*  ?MCV 96.4 96.6 94.6  ?PLT 294 260 278  ? ? ?  ?Medications:   ? ? [START ON 01/23/2022] calcitRIOL  0.5 mcg Oral Q T,Th,Sa-HD  ? Chlorhexidine Gluconate Cloth  6 each Topical Q0600  ? darbepoetin (ARANESP) injection - DIALYSIS  60 mcg Intravenous Q Fri-HD  ? heparin sodium (porcine)      ? kidney failure book   Does not apply Once  ? pantoprazole  40 mg Oral Daily  ? predniSONE  10 mg Oral Q breakfast  ? sevelamer carbonate  800 mg Oral TID WC  ? ? ? ?[name], MD ?01/22/2022, 1:14 PM  ? ?

## 2022-01-22 NOTE — TOC Progression Note (Signed)
Transition of Care (TOC) - Progression Note  ? ? ?Patient Details  ?Name: Cameron Peterson ?MRN: [id number] ?Date of Birth: Feb 15, 1966 ? ?Transition of Care (TOC) CM/SW Contact  ?Zenon Mayo, RN ?Phone Number: ?01/22/2022, 4:26 PM ? ?Clinical Narrative:    ?from home, s/p perm cath yesterday and today for 2nd dialysis, monitoring anemia,  ?Iv lasix on hold.  Toc will cont to follow for dc needs.  ? ? ?  ?  ? ?Expected Discharge Plan and Services ?  ?  ?  ?  ?  ?                ?  ?  ?  ?  ?  ?  ?  ?  ?  ?  ? ? ?Social Determinants of Health (SDOH) Interventions ?  ? ?Readmission Risk Interventions ?   ? View : No data to display.  ?  ?  ?  ? ? ?

## 2022-01-22 NOTE — Progress Notes (Signed)
?PROGRESS NOTE ? ? ? ?Cameron Peterson  [id number] DOB: 03/12/1966 DOA: 01/19/2022 ?PCP: Benito Mccreedy, MD  ? ? ?Brief Narrative:  ?56 year old with history of CKD stage IV with progressive disease, reported arthritis, gout, hypertension, chronic diastolic congestive heart failure, hyperlipidemia sent to the ER by primary care physician with abnormal creatinine. ?Patient does have a longstanding history of CKD, followed by nephrology at [facility]. ?He has been not feeling well for about a month with episodic abdominal discomfort may or may not related to the renal functions.  Denies any chest pain or shortness of breath.  Currently on tapering dose of prednisone for rheumatoid arthritis. ? ?Previous creatinine 4.6, creatinine 13 on 3/22 at PCP office, creatinine 14 at 4/11 PCP office. ? ?Assessment & Plan: ?  ?CKD stage V now with ESRD: ?Perm a cath placed 4/13 by IR.  HD 4/13, 4/14 . Tolerating well. Outpatient HD schedule pending. Clinically improved. ? ?Essential hypertension: Blood pressure stable.   ? ?Gout: Recent flareup treated with steroid taper.  Currently on prednisone 10 mg.  Continue. ? ? ?DVT prophylaxis: SCDs Start: 01/19/22 2331 ? ? ?Code Status: Full code ?Family Communication: Wife at the bedside ?Disposition Plan: Status is: Inpatient ?Remains inpatient appropriate because: Clinically stable.  Needs outpatient dialysis scheduled. ?  ? ? ?Consultants:  ?Nephrology ? ?Procedures:  ?None ? ?Antimicrobials:  ?None ? ? ?Subjective: ? ?Patient seen and examined.  No overnight events.  Denies any chest pain or abdominal pain.  Tolerated dialysis well yesterday.  Going for dialysis today.  Wife at the bedside. ?Telemetry with no events. ? ?Objective: ?Vitals:  ? 01/22/22 1030 01/22/22 1100 01/22/22 1113 01/22/22 1154  ?BP: (!) 148/93 (!) 151/86 (!) 144/89 (!) 152/81  ?Pulse: 72 73 71 78  ?Resp: '17 14 16 19  ' ?Temp:    98 ?F (36.7 ?C)  ?TempSrc:    Oral  ?SpO2:    100%  ?Weight:      ?Height:       ? ? ?Intake/Output Summary (Last 24 hours) at 01/22/2022 1208 ?Last data filed at 01/22/2022 1113 ?Gross per 24 hour  ?Intake 720 ml  ?Output 175 ml  ?Net 545 ml  ? ? ?Filed Weights  ? 01/21/22 1412 01/22/22 0429 01/22/22 0824  ?Weight: 118.3 kg 117.9 kg 117.9 kg  ? ? ?Examination: ? ?General: Looks comfortable.   ?Cardiovascular: S1-S2 normal.  No rubs or gallops. ?Respiratory: Bilateral clear.  No added sounds. ?Right IJ permacath clean and dryy ?Gastrointestinal: Soft.  Nontender.  Bowel sound present. ?Ext: Trace bilateral pedal edema.  No cyanosis or clubbing. ?Neuro: No deficits. ? ? ? ? ? ?Data Reviewed: I have personally reviewed following labs and imaging studies ? ?CBC: ?Recent Labs  ?Lab 01/19/22 ?1730 01/20/22 ?0412 01/21/22 ?0710  ?WBC 8.9 5.8 7.1  ?NEUTROABS 5.8  --   --   ?HGB 8.9* 7.6* 8.3*  ?HCT 26.9* 22.8* 24.7*  ?MCV 96.4 96.6 94.6  ?PLT 294 260 278  ? ? ?Basic Metabolic Panel: ?Recent Labs  ?Lab 01/19/22 ?1730 01/20/22 ?0412 01/21/22 ?0710 01/22/22 ?0340  ?NA 143 141 143 139  ?K 4.2 4.2 4.0 4.0  ?CL 111 112* 114* 109  ?CO2 15* 17* 18* 21*  ?GLUCOSE 117* 104* 94 105*  ?BUN 95* 93* 102* 73*  ?CREATININE 14.45* 13.71* 14.28* 10.92*  ?CALCIUM 7.5* 6.7* 6.8* 6.8*  ?MG  --   --   --  1.8  ?PHOS  --  11.2* 11.2* 8.1*  ? ? ?GFR: ?Estimated  Creatinine Clearance: 10.7 mL/min (A) (by C-G formula based on SCr of 10.92 mg/dL (H)). ?Liver Function Tests: ?Recent Labs  ?Lab 01/19/22 ?1730 01/21/22 ?0710 01/22/22 ?0340  ?AST 12*  --   --   ?ALT 14  --   --   ?ALKPHOS 51  --   --   ?BILITOT 0.5  --   --   ?PROT 5.6*  --   --   ?ALBUMIN 1.6* <1.5* <1.5*  ? ? ?No results for input(s): LIPASE, AMYLASE in the last 168 hours. ?No results for input(s): AMMONIA in the last 168 hours. ?Coagulation Profile: ?No results for input(s): INR, PROTIME in the last 168 hours. ?Cardiac Enzymes: ?No results for input(s): CKTOTAL, CKMB, CKMBINDEX, TROPONINI in the last 168 hours. ?BNP (last 3 results) ?No results for input(s): PROBNP  in the last 8760 hours. ?HbA1C: ?No results for input(s): HGBA1C in the last 72 hours. ?CBG: ?No results for input(s): GLUCAP in the last 168 hours. ?Lipid Profile: ?No results for input(s): CHOL, HDL, LDLCALC, TRIG, CHOLHDL, LDLDIRECT in the last 72 hours. ?Thyroid Function Tests: ?No results for input(s): TSH, T4TOTAL, FREET4, T3FREE, THYROIDAB in the last 72 hours. ?Anemia Panel: ?Recent Labs  ?  01/20/22 ?0412  ?FERRITIN 259  ?TIBC 153*  ?IRON 73  ? ? ?Sepsis Labs: ?No results for input(s): PROCALCITON, LATICACIDVEN in the last 168 hours. ? ?No results found for this or any previous visit (from the past 240 hour(s)).  ? ? ? ? ? ?Radiology Studies: ?IR Fluoro Guide CV Line Right ? ?Result Date: 01/21/2022 ?INDICATION: AKI. ESRD requiring HD. EXAM: TUNNELED CENTRAL VENOUS HEMODIALYSIS CATHETER PLACEMENT WITH ULTRASOUND AND FLUOROSCOPIC GUIDANCE MEDICATIONS: Ancef 2 gm IV . The antibiotic was given in an appropriate time interval prior to skin puncture. ANESTHESIA/SEDATION: Moderate (conscious) sedation was employed during this procedure. A total of Versed 1 mg and Fentanyl 50 mcg was administered intravenously. Moderate Sedation Time: 14 minutes. The patient's level of consciousness and vital signs were monitored continuously by radiology nursing throughout the procedure under my direct supervision. FLUOROSCOPY TIME:  Fluoroscopic dose; 1 mGy COMPLICATIONS: None immediate. PROCEDURE: Informed written consent was obtained from the the patient and/or patient's representative after a discussion of the risks, benefits, and alternatives to treatment. Questions regarding the procedure were encouraged and answered. The RIGHT neck and chest were prepped with chlorhexidine in a sterile fashion, and a sterile drape was applied covering the operative field. Maximum barrier sterile technique with sterile gowns and gloves were used for the procedure. A timeout was performed prior to the initiation of the procedure. After  creating a small venotomy incision, a micropuncture kit was utilized to access the internal jugular vein. Real-time ultrasound guidance was utilized for vascular access including the acquisition of a permanent ultrasound image documenting patency of the accessed vessel. The microwire was utilized to measure appropriate catheter length. A stiff Glidewire was advanced to the level of the IVC and the micropuncture sheath was exchanged for a peel-away sheath. A palindrome tunneled hemodialysis catheter measuring 23 cm from tip to cuff was tunneled in a retrograde fashion from the anterior chest wall to the venotomy incision. The catheter was then placed through the peel-away sheath with tips ultimately positioned within the superior aspect of the right atrium. Final catheter positioning was confirmed and documented with a spot radiographic image. The catheter aspirates and flushes normally. The catheter was flushed with appropriate volume heparin dwells. The catheter exit site was secured with a 0-Silk retention suture. The venotomy  incision was closed wit Dermabond. Dressings were applied. The patient tolerated the procedure well without immediate post procedural complication. IMPRESSION: Successful placement of 23 cm tip to cuff tunneled hemodialysis catheter via the RIGHT internal jugular vein, as above. The tip of the catheter is positioned within the proximal RIGHT atrium. The catheter is ready for immediate use. Michaelle Birks, MD [facility] Radiology Electronically Signed   By: Michaelle Birks M.D.   On: 01/21/2022 13:48  ? ?IR US Guide Vasc Access Right ? ?Result Date: 01/21/2022 ?INDICATION: AKI. ESRD requiring HD. EXAM: TUNNELED CENTRAL VENOUS HEMODIALYSIS CATHETER PLACEMENT WITH ULTRASOUND AND FLUOROSCOPIC GUIDANCE MEDICATIONS: Ancef 2 gm IV . The antibiotic was given in an appropriate time interval prior to skin puncture. ANESTHESIA/SEDATION: Moderate (conscious)  sedation was employed during this procedure. A total of Versed 1 mg and Fentanyl 50 mcg was administered intravenously. Moderate Sedation Time: 14 minutes. The patient's level of consciousness and vital signs were

## 2022-01-22 NOTE — Progress Notes (Signed)
Rounded on patient today in correlation to transition to outpatient HD. Kidney Failure book given. Patient educated at the bedside regarding care of tunneled dialysis catheter and proper medication administration on HD days.  Patient also educated on the importance of adhering to scheduled dialysis treatments, the effects of fluid overload, hyperkalemia and hyperphosphatemia. Patient capable of re-verbalizing via teach back method. Also educated patient on services available through the interdisciplinary team in the clinic setting. Educated patient briefly about PD including risk and benefits. Patient with no further questions at this time. Handouts and contact information provided to patient for any further assistance. Will follow as appropriate. ?

## 2022-01-23 DIAGNOSIS — N179 Acute kidney failure, unspecified: Secondary | ICD-10-CM | POA: Diagnosis not present

## 2022-01-23 DIAGNOSIS — N184 Chronic kidney disease, stage 4 (severe): Secondary | ICD-10-CM | POA: Diagnosis not present

## 2022-01-23 LAB — RENAL FUNCTION PANEL
Albumin: <1.5 g/dL — ABNORMAL LOW (ref 3.5–5.0)
Anion gap: 6 (ref 5–15)
BUN: 50 mg/dL — ABNORMAL HIGH (ref 6–20)
CO2: 26 mmol/L (ref 22–32)
Calcium: 7.1 mg/dL — ABNORMAL LOW (ref 8.9–10.3)
Chloride: 110 mmol/L (ref 98–111)
Creatinine, Ser: 8.63 mg/dL — ABNORMAL HIGH (ref 0.61–1.24)
GFR, Estimated: 7 mL/min — ABNORMAL LOW (ref >60)
Glucose, Bld: 96 mg/dL (ref 70–99)
Phosphorus: 6.3 mg/dL — ABNORMAL HIGH (ref 2.5–4.6)
Potassium: 4.3 mmol/L (ref 3.5–5.1)
Sodium: 142 mmol/L (ref 135–145)

## 2022-01-23 MED ORDER — PREDNISONE 5 MG PO TABS
5.0000 mg | ORAL_TABLET | Freq: Every day | ORAL | Status: DC
  Administered 2022-01-23 – 2022-01-25 (×3): 5 mg via ORAL
  Filled 2022-01-23 (×2): qty 1

## 2022-01-23 MED ORDER — CALCITRIOL 0.5 MCG PO CAPS
ORAL_CAPSULE | ORAL | Status: AC
  Filled 2022-01-23: qty 1

## 2022-01-23 MED ORDER — HEPARIN SODIUM (PORCINE) 1000 UNIT/ML IJ SOLN
INTRAMUSCULAR | Status: AC
  Administered 2022-01-23: 3800 [IU]
  Filled 2022-01-23: qty 4

## 2022-01-23 MED ORDER — PREDNISONE 5 MG PO TABS: 5.0000 mg | ORAL_TABLET | Freq: Every day | ORAL | Status: DC

## 2022-01-23 NOTE — Progress Notes (Signed)
?  [facility] ?Progress Note  ? ?Assessment/ Plan:   ?1. ESRD -third initiation session of dialysis today then maintain TTS schedule.  Will be transferring to transitional care unit from here so that he can decide between HD and PD going forward. Will hold off on AVF creation this admission.  ? ?2. Anemia: Iron levels normal and hemoglobin low but stable.  Aranesp added. Transfuse if Hgb <7. ? ?3. CKD-MBD: PTH significantly elevated, low calcium, low vitamin D.  Started calcitriol this admission.  Phosphorus has improved with dialysis ? ?4. Hypertension: Continue current medications.  Consider adjusting medications after dialysis today ? ?5. Chronic diastolic HF: management per primary. Will optimize volume status with HD. ? ?6.  Dispo: Working on placement at [facility] ? ? ?Subjective:   ? ?Patient feels well today with no complaints.  Tolerating dialysis without any issues. ?  ? ?Objective:   ?BP (!) 149/104   Pulse (!) 59   Temp 98.7 ?F (37.1 ?C) (Oral)   Resp 11   Ht 6' 3.98" (1.93 m)   Wt 117.3 kg   SpO2 99%   BMI 31.49 kg/m?  ? ?Physical Exam: ?Gen: Lying in bed, no distress, receiving dialysis ?CV: Normal rate, no audible rub ?Pulm: Bilateral chest rise with no increased work of breathing ?Abd: soft, nontender, nondistended, normoactive bowel sounds. ?Extremities: trace BLE edema, warm and well perfused ?Neuro: AAOx3, no focal deficits noted. ? ? ?Labs: ?BMET ?Recent Labs  ?Lab 01/19/22 ?1730 01/20/22 ?0412 01/21/22 ?0710 01/22/22 ?7579 01/23/22 ?0406  ?NA 143 141 143 139 142  ?K 4.2 4.2 4.0 4.0 4.3  ?CL 111 112* 114* 109 110  ?CO2 15* 17* 18* 21* 26  ?GLUCOSE 117* 104* 94 105* 96  ?BUN 95* 93* 102* 73* 50*  ?CREATININE 14.45* 13.71* 14.28* 10.92* 8.63*  ?CALCIUM 7.5* 6.7* 6.8* 6.8* 7.1*  ?PHOS  --  11.2* 11.2* 8.1* 6.3*  ? ?CBC ?Recent Labs  ?Lab 01/19/22 ?1730 01/20/22 ?0412 01/21/22 ?0710  ?WBC 8.9 5.8 7.1  ?NEUTROABS 5.8  --   --   ?HGB 8.9* 7.6* 8.3*  ?HCT 26.9* 22.8* 24.7*  ?MCV 96.4  96.6 94.6  ?PLT 294 260 278  ? ? ?  ?Medications:   ? ? calcitRIOL  0.5 mcg Oral Q T,Th,Sa-HD  ? Chlorhexidine Gluconate Cloth  6 each Topical Q0600  ? darbepoetin (ARANESP) injection - DIALYSIS  60 mcg Intravenous Q Fri-HD  ? pantoprazole  40 mg Oral Daily  ? predniSONE  10 mg Oral Q breakfast  ? sevelamer carbonate  800 mg Oral TID WC  ? ? ?Reesa Chew ? ?01/23/2022, 8:02 AM  ? ?

## 2022-01-23 NOTE — Progress Notes (Signed)
?PROGRESS NOTE ? ? ? ?Cameron Peterson  ZJI:[id number] DOB: Jun 05, 1966 DOA: 01/19/2022 ?PCP: Benito Mccreedy, MD  ? ? ?Brief Narrative:  ?56 year old with history of CKD stage IV with progressive disease, reported arthritis, gout, hypertension, chronic diastolic congestive heart failure, hyperlipidemia sent to the ER by primary care physician with abnormal creatinine. ?Patient does have a longstanding history of CKD, followed by nephrology at [facility]. ?He has been not feeling well for about a month with episodic abdominal discomfort may or may not related to the renal functions.  Denies any chest pain or shortness of breath.  Currently on tapering dose of prednisone for rheumatoid arthritis. ? ?Previous creatinine 4.6, creatinine 13 on 3/22 at PCP office, creatinine 14 at 4/11 PCP office. ?Admitted to hospital and started on dialysis. ? ? ?Assessment & Plan: ?  ?CKD stage V now with ESRD: Anemia of chronic kidney disease.  Hyperphosphatemia. ?Permacath/13 ?Hemodialysis 4/13, 14, 15.  Clip in process.  Discharge when available. ?On calcitriol.  Given Aranesp for anemia of chronic disease.  Hemoglobin is stable. ?Probably can go back on losartan, will discuss with nephrology. ? ?Gout: Recent flareup treated with steroid taper.  Currently on prednisone 10 mg.   ?On chronic prednisone therapy 5 mg, taper to 5 mg and continue on discharge. ? ? ?DVT prophylaxis: SCDs Start: 01/19/22 2331 ? ? ?Code Status: Full code ?Family Communication: None. ?Disposition Plan: Status is: Inpatient ?Remains inpatient appropriate because: Clinically stable.  Needs outpatient dialysis scheduled. ?  ? ? ?Consultants:  ?Nephrology ? ?Procedures:  ?None ? ?Antimicrobials:  ?None ? ? ?Subjective: ? ?Seen and examined.  Getting dialysis.  No new events.  Looking forward to go home.  Blood pressure is picking up. ? ?Objective: ?Vitals:  ? 01/23/22 0800 01/23/22 0830 01/23/22 0900 01/23/22 0930  ?BP: (!) 177/94 (!) 159/98 (!) 166/96 (!) 164/97   ?Pulse: 60 61 (!) 57 (!) 58  ?Resp:      ?Temp:      ?TempSrc:      ?SpO2:      ?Weight:      ?Height:      ? ? ?Intake/Output Summary (Last 24 hours) at 01/23/2022 1009 ?Last data filed at 01/23/2022 0555 ?Gross per 24 hour  ?Intake 360.22 ml  ?Output 900 ml  ?Net -539.78 ml  ? ? ?Filed Weights  ? 01/22/22 0824 01/23/22 0419 01/23/22 0616  ?Weight: 117.9 kg 117.3 kg 116.2 kg  ? ? ?Examination: ? ?Looks comfortable.  Currently getting dialysis. ? ? ? ? ? ?Data Reviewed: I have personally reviewed following labs and imaging studies ? ?CBC: ?Recent Labs  ?Lab 01/19/22 ?1730 01/20/22 ?0412 01/21/22 ?0710  ?WBC 8.9 5.8 7.1  ?NEUTROABS 5.8  --   --   ?HGB 8.9* 7.6* 8.3*  ?HCT 26.9* 22.8* 24.7*  ?MCV 96.4 96.6 94.6  ?PLT 294 260 278  ? ? ?Basic Metabolic Panel: ?Recent Labs  ?Lab 01/19/22 ?1730 01/20/22 ?0412 01/21/22 ?0710 01/22/22 ?1751 01/23/22 ?0406  ?NA 143 141 143 139 142  ?K 4.2 4.2 4.0 4.0 4.3  ?CL 111 112* 114* 109 110  ?CO2 15* 17* 18* 21* 26  ?GLUCOSE 117* 104* 94 105* 96  ?BUN 95* 93* 102* 73* 50*  ?CREATININE 14.45* 13.71* 14.28* 10.92* 8.63*  ?CALCIUM 7.5* 6.7* 6.8* 6.8* 7.1*  ?MG  --   --   --  1.8  --   ?PHOS  --  11.2* 11.2* 8.1* 6.3*  ? ? ?GFR: ?Estimated Creatinine Clearance: 13.5 mL/min (A) (by  C-G formula based on SCr of 8.63 mg/dL (H)). ?Liver Function Tests: ?Recent Labs  ?Lab 01/19/22 ?1730 01/21/22 ?0710 01/22/22 ?5993 01/23/22 ?0406  ?AST 12*  --   --   --   ?ALT 14  --   --   --   ?ALKPHOS 51  --   --   --   ?BILITOT 0.5  --   --   --   ?PROT 5.6*  --   --   --   ?ALBUMIN 1.6* <1.5* <1.5* <1.5*  ? ? ?No results for input(s): LIPASE, AMYLASE in the last 168 hours. ?No results for input(s): AMMONIA in the last 168 hours. ?Coagulation Profile: ?No results for input(s): INR, PROTIME in the last 168 hours. ?Cardiac Enzymes: ?No results for input(s): CKTOTAL, CKMB, CKMBINDEX, TROPONINI in the last 168 hours. ?BNP (last 3 results) ?No results for input(s): PROBNP in the last 8760 hours. ?HbA1C: ?No  results for input(s): HGBA1C in the last 72 hours. ?CBG: ?No results for input(s): GLUCAP in the last 168 hours. ?Lipid Profile: ?No results for input(s): CHOL, HDL, LDLCALC, TRIG, CHOLHDL, LDLDIRECT in the last 72 hours. ?Thyroid Function Tests: ?No results for input(s): TSH, T4TOTAL, FREET4, T3FREE, THYROIDAB in the last 72 hours. ?Anemia Panel: ?No results for input(s): VITAMINB12, FOLATE, FERRITIN, TIBC, IRON, RETICCTPCT in the last 72 hours. ? ?Sepsis Labs: ?No results for input(s): PROCALCITON, LATICACIDVEN in the last 168 hours. ? ?No results found for this or any previous visit (from the past 240 hour(s)).  ? ? ? ? ? ?Radiology Studies: ?No results found. ? ? ? ? ? ?Scheduled Meds: ? calcitRIOL      ? calcitRIOL  0.5 mcg Oral Q T,Th,Sa-HD  ? Chlorhexidine Gluconate Cloth  6 each Topical Q0600  ? darbepoetin (ARANESP) injection - DIALYSIS  60 mcg Intravenous Q Fri-HD  ? heparin sodium (porcine)      ? pantoprazole  40 mg Oral Daily  ? predniSONE  10 mg Oral Q breakfast  ? sevelamer carbonate  800 mg Oral TID WC  ? ?Continuous Infusions: ? sodium chloride 10 mL/hr at 01/21/22 0022  ? ? ? LOS: 4 days  ? ? ?Time spent: 25 minutes ? ? ? ?Barb Merino, MD ?[facility] ?Pager [phone]  ?

## 2022-01-24 DIAGNOSIS — N179 Acute kidney failure, unspecified: Secondary | ICD-10-CM | POA: Diagnosis not present

## 2022-01-24 DIAGNOSIS — N184 Chronic kidney disease, stage 4 (severe): Secondary | ICD-10-CM | POA: Diagnosis not present

## 2022-01-24 LAB — RENAL FUNCTION PANEL
Albumin: <1.5 g/dL — ABNORMAL LOW (ref 3.5–5.0)
Anion gap: 6 (ref 5–15)
BUN: 30 mg/dL — ABNORMAL HIGH (ref 6–20)
CO2: 28 mmol/L (ref 22–32)
Calcium: 7.3 mg/dL — ABNORMAL LOW (ref 8.9–10.3)
Chloride: 107 mmol/L (ref 98–111)
Creatinine, Ser: 6.89 mg/dL — ABNORMAL HIGH (ref 0.61–1.24)
GFR, Estimated: 9 mL/min — ABNORMAL LOW (ref >60)
Glucose, Bld: 87 mg/dL (ref 70–99)
Phosphorus: 5.5 mg/dL — ABNORMAL HIGH (ref 2.5–4.6)
Potassium: 3.2 mmol/L — ABNORMAL LOW (ref 3.5–5.1)
Sodium: 141 mmol/L (ref 135–145)

## 2022-01-24 MED ORDER — LOSARTAN POTASSIUM 25 MG PO TABS
25.0000 mg | ORAL_TABLET | Freq: Every day | ORAL | Status: DC
  Administered 2022-01-24 – 2022-01-25 (×2): 25 mg via ORAL
  Filled 2022-01-24 (×2): qty 1

## 2022-01-24 NOTE — Progress Notes (Signed)
?  [facility] ?Progress Note  ? ?Assessment/ Plan:   ?1. 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  Now status post 3 sessions of dialysis.  Plan to maintain TTS schedule.  Holding off on AVF creation due to the patient's desire for peritoneal dialysis.  Hopefully placement at TCU to help facilitate this.   ? ?2. Anemia: Iron levels normal and hemoglobin low but stable.  Aranesp added. Transfuse if Hgb <7. ? ?3. CKD-MBD: PTH significantly elevated, low calcium, low vitamin D.  Started calcitriol this admission.  Phosphorus has improved with dialysis ? ?4. Hypertension: BP remains high after HD. Will start losartan 25mg  daily given he is ESRD ? ?5. Chronic diastolic HF: management per primary. Will optimize volume status with HD. ? ?6.  Dispo: Working on placement at [facility]; hopefully early this week ? ? ?Subjective:   ? ?Patient feels well today with no complaints.  Tolerated dialysis yesterday with no issues ?  ? ?Objective:   ?BP (!) 156/87 (BP Location: Right Arm)   Pulse 71   Temp 98.8 ?F (37.1 ?C) (Oral)   Resp 18   Ht 6' 3.98" (1.93 m)   Wt 116.3 kg   SpO2 100%   BMI 31.24 kg/m?  ? ?Physical Exam: ?Gen: Lying in bed, no distress,  ?CV: Normal rate, no audible rub ?Pulm: Bilateral chest rise with no increased work of breathing ?Abd: soft, nontender, nondistended, normoactive bowel sounds. ?Extremities: trace BLE edema, warm and well perfused ?Neuro: AAOx3, no focal deficits noted. ? ? ?Labs: ?BMET ?Recent Labs  ?Lab 01/19/22 ?1730 01/20/22 ?0412 01/21/22 ?0710 01/22/22 ?7124 01/23/22 ?0406 01/24/22 ?0414  ?NA 143 141 143 139 142 141  ?K 4.2 4.2 4.0 4.0 4.3 3.2*  ?CL 111 112* 114* 109 110 107  ?CO2 15* 17* 18* 21* 26 28  ?GLUCOSE 117* 104* 94 105* 96 87  ?BUN 95* 93* 102* 73* 50* 30*  ?CREATININE 14.45* 13.71* 14.28* 10.92* 8.63* 6.89*  ?CALCIUM 7.5* 6.7* 6.8* 6.8* 7.1* 7.3*  ?PHOS  --  11.2* 11.2* 8.1* 6.3* 5.5*   ? ?CBC ?Recent Labs  ?Lab 01/19/22 ?1730 01/20/22 ?0412 01/21/22 ?0710  ?WBC 8.9 5.8 7.1  ?NEUTROABS 5.8  --   --   ?HGB 8.9* 7.6* 8.3*  ?HCT 26.9* 22.8* 24.7*  ?MCV 96.4 96.6 94.6  ?PLT 294 260 278  ? ? ?  ?Medications:   ? ? calcitRIOL  0.5 mcg Oral Q T,Th,Sa-HD  ? Chlorhexidine Gluconate Cloth  6 each Topical Q0600  ? darbepoetin (ARANESP) injection - DIALYSIS  60 mcg Intravenous Q Fri-HD  ? pantoprazole  40 mg Oral Daily  ? predniSONE  5 mg Oral Q breakfast  ? sevelamer carbonate  800 mg Oral TID WC  ? ? ?Reesa Chew ? ?01/24/2022, 9:03 AM  ? ?

## 2022-01-24 NOTE — Progress Notes (Signed)
?PROGRESS NOTE ? ? ? ?Cameron Peterson  UDJ:[id number] DOB: 1966-09-06 DOA: 01/19/2022 ?PCP: Benito Mccreedy, MD  ? ? ?Brief Narrative:  ?ESRD.  New onset dialysis. ? ? ?Assessment & Plan: ?  ?ESRD, new onset dialysis ?Mild uremic symptoms on presentation ?Essential hypertension ?Anemia of chronic kidney disease ?Gout on tapering dose of prednisone ? ?Medically stabilized.  Waiting for outpatient dialysis chair. ? ? ? ?DVT prophylaxis: SCDs Start: 01/19/22 2331 ? ? ?Code Status: Full code ?Family Communication: None ?Disposition Plan: Status is: Inpatient ?Remains inpatient appropriate because: No outpatient chair available yet. ?  ? ? ?Consultants:  ?Nephrology ? ?Procedures:  ?Dialysis, right-sided permacath ? ?Antimicrobials:  ?None ? ? ?Subjective: ?Seen and examined.  No overnight events.  Denies any complaints. ? ?Objective: ?Vitals:  ? 01/23/22 1200 01/23/22 2002 01/24/22 0441 01/24/22 0749  ?BP: 135/71 136/72 (!) 151/89 (!) 156/87  ?Pulse:  68 72 71  ?Resp:  19 17 18   ?Temp:  98.5 ?F (36.9 ?C) 98.6 ?F (37 ?C) 98.8 ?F (37.1 ?C)  ?TempSrc:  Oral Oral Oral  ?SpO2:  100% 96% 100%  ?Weight:   116.3 kg   ?Height:      ? ? ?Intake/Output Summary (Last 24 hours) at 01/24/2022 1053 ?Last data filed at 01/24/2022 0849 ?Gross per 24 hour  ?Intake 478 ml  ?Output 1125 ml  ?Net -647 ml  ? ?Filed Weights  ? 01/23/22 0616 01/23/22 0930 01/24/22 0441  ?Weight: 116.2 kg (P) 116.2 kg 116.3 kg  ? ? ?Examination: ? ?Looks comfortable.  On room air. ?No more leg edema. ? ? ?Data Reviewed: I have personally reviewed following labs and imaging studies ? ?CBC: ?Recent Labs  ?Lab 01/19/22 ?1730 01/20/22 ?0412 01/21/22 ?0710  ?WBC 8.9 5.8 7.1  ?NEUTROABS 5.8  --   --   ?HGB 8.9* 7.6* 8.3*  ?HCT 26.9* 22.8* 24.7*  ?MCV 96.4 96.6 94.6  ?PLT 294 260 278  ? ?Basic Metabolic Panel: ?Recent Labs  ?Lab 01/20/22 ?0412 01/21/22 ?0710 01/22/22 ?5885 01/23/22 ?0406 01/24/22 ?0414  ?NA 141 143 139 142 141  ?K 4.2 4.0 4.0 4.3 3.2*  ?CL 112* 114*  109 110 107  ?CO2 17* 18* 21* 26 28  ?GLUCOSE 104* 94 105* 96 87  ?BUN 93* 102* 73* 50* 30*  ?CREATININE 13.71* 14.28* 10.92* 8.63* 6.89*  ?CALCIUM 6.7* 6.8* 6.8* 7.1* 7.3*  ?MG  --   --  1.8  --   --   ?PHOS 11.2* 11.2* 8.1* 6.3* 5.5*  ? ?GFR: ?Estimated Creatinine Clearance: 16.9 mL/min (A) (by C-G formula based on SCr of 6.89 mg/dL (H)). ?Liver Function Tests: ?Recent Labs  ?Lab 01/19/22 ?1730 01/21/22 ?0710 01/22/22 ?0277 01/23/22 ?0406 01/24/22 ?0414  ?AST 12*  --   --   --   --   ?ALT 14  --   --   --   --   ?ALKPHOS 51  --   --   --   --   ?BILITOT 0.5  --   --   --   --   ?PROT 5.6*  --   --   --   --   ?ALBUMIN 1.6* <1.5* <1.5* <1.5* <1.5*  ? ?No results for input(s): LIPASE, AMYLASE in the last 168 hours. ?No results for input(s): AMMONIA in the last 168 hours. ?Coagulation Profile: ?No results for input(s): INR, PROTIME in the last 168 hours. ?Cardiac Enzymes: ?No results for input(s): CKTOTAL, CKMB, CKMBINDEX, TROPONINI in the last 168 hours. ?BNP (last 3  results) ?No results for input(s): PROBNP in the last 8760 hours. ?HbA1C: ?No results for input(s): HGBA1C in the last 72 hours. ?CBG: ?No results for input(s): GLUCAP in the last 168 hours. ?Lipid Profile: ?No results for input(s): CHOL, HDL, LDLCALC, TRIG, CHOLHDL, LDLDIRECT in the last 72 hours. ?Thyroid Function Tests: ?No results for input(s): TSH, T4TOTAL, FREET4, T3FREE, THYROIDAB in the last 72 hours. ?Anemia Panel: ?No results for input(s): VITAMINB12, FOLATE, FERRITIN, TIBC, IRON, RETICCTPCT in the last 72 hours. ?Sepsis Labs: ?No results for input(s): PROCALCITON, LATICACIDVEN in the last 168 hours. ? ?No results found for this or any previous visit (from the past 240 hour(s)).  ? ? ? ? ? ?Radiology Studies: ?No results found. ? ? ? ? ? ?Scheduled Meds: ? calcitRIOL  0.5 mcg Oral Q T,Th,Sa-HD  ? Chlorhexidine Gluconate Cloth  6 each Topical Q0600  ? darbepoetin (ARANESP) injection - DIALYSIS  60 mcg Intravenous Q Fri-HD  ? losartan  25 mg  Oral Daily  ? pantoprazole  40 mg Oral Daily  ? predniSONE  5 mg Oral Q breakfast  ? sevelamer carbonate  800 mg Oral TID WC  ? ?Continuous Infusions: ? sodium chloride 10 mL/hr at 01/21/22 0022  ? ? ? LOS: 5 days  ? ? ?Time spent: 25 minutes ? ? ? ?Barb Merino, MD ?[facility] ?Pager [phone] ? ?

## 2022-01-25 DIAGNOSIS — I10 Essential (primary) hypertension: Secondary | ICD-10-CM | POA: Diagnosis not present

## 2022-01-25 DIAGNOSIS — M1A3710 Chronic gout due to renal impairment, right ankle and foot, without tophus (tophi): Secondary | ICD-10-CM | POA: Diagnosis not present

## 2022-01-25 DIAGNOSIS — N184 Chronic kidney disease, stage 4 (severe): Secondary | ICD-10-CM | POA: Diagnosis not present

## 2022-01-25 DIAGNOSIS — N179 Acute kidney failure, unspecified: Secondary | ICD-10-CM | POA: Diagnosis not present

## 2022-01-25 LAB — RENAL FUNCTION PANEL
Albumin: <1.5 g/dL — ABNORMAL LOW (ref 3.5–5.0)
Anion gap: 7 (ref 5–15)
BUN: 37 mg/dL — ABNORMAL HIGH (ref 6–20)
CO2: 25 mmol/L (ref 22–32)
Calcium: 7.4 mg/dL — ABNORMAL LOW (ref 8.9–10.3)
Chloride: 109 mmol/L (ref 98–111)
Creatinine, Ser: 8.44 mg/dL — ABNORMAL HIGH (ref 0.61–1.24)
GFR, Estimated: 7 mL/min — ABNORMAL LOW (ref >60)
Glucose, Bld: 114 mg/dL — ABNORMAL HIGH (ref 70–99)
Phosphorus: 5.4 mg/dL — ABNORMAL HIGH (ref 2.5–4.6)
Potassium: 3.5 mmol/L (ref 3.5–5.1)
Sodium: 141 mmol/L (ref 135–145)

## 2022-01-25 MED ORDER — SEVELAMER CARBONATE 800 MG PO TABS: 800.0000 mg | ORAL_TABLET | Freq: Three times a day (TID) | ORAL | 0 refills | Status: AC

## 2022-01-25 MED ORDER — CALCITRIOL 0.5 MCG PO CAPS: 0.5000 ug | ORAL_CAPSULE | ORAL | 0 refills | Status: AC

## 2022-01-25 MED ORDER — LOSARTAN POTASSIUM 25 MG PO TABS: 50.0000 mg | ORAL_TABLET | Freq: Every day | ORAL | 0 refills | Status: DC

## 2022-01-25 NOTE — Progress Notes (Signed)
Pt has been accepted at [facility] at [facility]. Pt will receive HD on Monday, Tuesday, Thursday, Friday with 8:00 chair time. Pt can start tomorrow and will need to arrive at 8:00. Met with pt at bedside. Pt's wife on speaker phone. Discussed pt's approval for TCU. Discussed schedule and chair time. Pt aware he will start at clinic tomorrow. Schedule letter provided to pt. Update provided to attending, nephrologist, pt's RN, and RN CM. Arrangements added to pt's AVS as well. Contacted renal PA regarding clinic's need for orders. Pt to d/c today and start at clinic tomorrow.  ? ?Melven Sartorius ?Renal Navigator ?[phone] ?

## 2022-01-25 NOTE — Plan of Care (Signed)

## 2022-01-25 NOTE — Progress Notes (Signed)
Patient dishchared to home. IV removed. Discharge instructions explained and given to patient. ?

## 2022-01-25 NOTE — Progress Notes (Signed)
Patient complaining of pain in right knee. Patient states pain started in ankle first. Offered tylenol. Patient refused. Will let MD know. ?

## 2022-01-25 NOTE — Discharge Summary (Signed)
Physician Discharge Summary  ?Cameron Peterson JJO:[id number] DOB: May 08, 1966 DOA: 01/19/2022 ? ?PCP: Benito Mccreedy, MD ? ?Admit date: 01/19/2022 ?Discharge date: 01/25/2022 ? ?Admitted From: Home ?Disposition: Home ? ?Recommendations for Outpatient Follow-up:  ? ? ?Outpatient dialysis is scheduled ? ?Home Health: N/A ?Equipment/Devices: N/A ? ?Discharge Condition: Stable ?CODE STATUS: Full code ?Diet recommendation: Low-salt diet ? ?Discharge summary: ? ?Progressive kidney disease admitted with advanced CKD and worsening GFR with mild uremic symptoms nausea and abdominal pain.  Admitted to hospital.  Permacath placement right subclavian. ?3 subsequent dialysis in the hospital.  Outpatient dialysis scheduled, next dialysis tomorrow.  Stable.  Discharging home with further follow-up with nephrology deciding permanent access versus peritoneal dialysis. ?Calcitriol and PhosLo prescribed as recommended by nephrology. ? ?Hypertension: Blood pressure stable.  Losartan was resumed in lower doses 25 mg daily.  Will resume carvedilol as he was taking in the past.  3.125 mg twice daily. ? ?History of gout: On tapering dose of prednisone.  He will continue 5 mg prednisone now as he has recurrent pain on his right knee without obvious inflammation.  Patient wants to follow-up with local rheumatology, will send a referral for future follow-up.  Tylenol as needed.  Allopurinol as per nephrology. ? ?Stable for discharge. ? ?Discharge Diagnoses:  ?Principal Problem: ?  Acute on chronic kidney failure ([place]) ?Active Problems: ?  Gout ?  Diastolic CHF, chronic (HCC) ?  Essential hypertension ? ? ? ?Discharge Instructions ? ?Discharge Instructions   ? ? Ambulatory referral to Rheumatology   Complete by: As directed ?  ? Diet - low sodium heart healthy   Complete by: As directed ?  ? Increase activity slowly   Complete by: As directed ?  ? No dressing needed   Complete by: As directed ?  ? ?  ? ?Allergies as of 01/25/2022   ? ?    Reactions  ? Lisinopril Swelling  ? Shellfish Allergy Swelling  ? ?  ? ?  ?Medication List  ?  ? ?STOP taking these medications   ? ?furosemide 40 MG tablet ?Commonly known as: Lasix ?  ?hydrALAZINE 10 MG tablet ?Commonly known as: APRESOLINE ?  ?isosorbide mononitrate 30 MG 24 hr tablet ?Commonly known as: IMDUR ?  ?naproxen sodium 220 MG tablet ?Commonly known as: ALEVE ?  ?sucralfate 1 g tablet ?Commonly known as: CARAFATE ?  ? ?  ? ?TAKE these medications   ? ?calcitRIOL 0.5 MCG capsule ?Commonly known as: ROCALTROL ?Take 1 capsule (0.5 mcg total) by mouth Every Tuesday,Thursday,and Saturday with dialysis. ?Start taking on: January 26, 2022 ?  ?carvedilol 3.125 MG tablet ?Commonly known as: COREG ?Take 1 tablet (3.125 mg total) by mouth 2 (two) times daily with a meal. ?  ?losartan 25 MG tablet ?Commonly known as: COZAAR ?Take 2 tablets (50 mg total) by mouth daily. ?What changed:  ?medication strength ?when to take this ?  ?pantoprazole 40 MG tablet ?Commonly known as: PROTONIX ?Take 40 mg by mouth daily. ?  ?predniSONE 10 MG tablet ?Commonly known as: DELTASONE ?Take 10-60 mg by mouth See admin instructions. 54m for days 1-3, 55mdays 4-6, 4034mays 7-9, 7m47mys 10-12, 20mg75ms 13-15, 10mg 62m 16-18. Pt is on day 4 ?  ?sevelamer carbonate 800 MG tablet ?Commonly known as: RENVELA ?Take 1 tablet (800 mg total) by mouth 3 (three) times daily with meals. ?  ?[place] 1 drop into both eyes daily as needed (dry eyes). ?  ? ?  ? ?  ?  ? ? ?  ?  Discharge Care Instructions  ?(From admission, onward)  ?  ? ? ?  ? ?  Start     Ordered  ? 01/25/22 0000  No dressing needed       ? 01/25/22 1036  ? ?  ?  ? ?  ? ? ?Allergies  ?Allergen Reactions  ? Lisinopril Swelling  ? Shellfish Allergy Swelling  ? ? ?Consultations: ?Nephrology ? ? ?Procedures/Studies: ?US RENAL ? ?Result Date: 01/20/2022 ?CLINICAL DATA:  Acute kidney injury EXAM: RENAL / URINARY TRACT ULTRASOUND COMPLETE COMPARISON:  Ultrasound 02/02/2015  FINDINGS: Right Kidney: Renal measurements: 10.7 x 4.5 x 4.2 cm = volume: 104.3 mL. Echogenic right kidney. No mass or hydronephrosis Left Kidney: Renal measurements: 10.7 x 5.4 x 5.2 cm = volume: 156.3 mL. Echogenic left kidney. No hydronephrosis. Cyst at the mid to upper pole measuring 2 cm, no follow-up imaging recommended Bladder: Appears normal for degree of bladder distention. Other: None. IMPRESSION: 1. Echogenic kidneys bilaterally consistent with medical renal disease. 2. Negative for hydronephrosis Electronically Signed   By: Donavan Foil M.D.   On: 01/20/2022 00:06  ? ?IR Fluoro Guide CV Line Right ? ?Result Date: 01/21/2022 ?INDICATION: AKI. ESRD requiring HD. EXAM: TUNNELED CENTRAL VENOUS HEMODIALYSIS CATHETER PLACEMENT WITH ULTRASOUND AND FLUOROSCOPIC GUIDANCE MEDICATIONS: Ancef 2 gm IV . The antibiotic was given in an appropriate time interval prior to skin puncture. ANESTHESIA/SEDATION: Moderate (conscious) sedation was employed during this procedure. A total of Versed 1 mg and Fentanyl 50 mcg was administered intravenously. Moderate Sedation Time: 14 minutes. The patient's level of consciousness and vital signs were monitored continuously by radiology nursing throughout the procedure under my direct supervision. FLUOROSCOPY TIME:  Fluoroscopic dose; 1 mGy COMPLICATIONS: None immediate. PROCEDURE: Informed written consent was obtained from the the patient and/or patient's representative after a discussion of the risks, benefits, and alternatives to treatment. Questions regarding the procedure were encouraged and answered. The RIGHT neck and chest were prepped with chlorhexidine in a sterile fashion, and a sterile drape was applied covering the operative field. Maximum barrier sterile technique with sterile gowns and gloves were used for the procedure. A timeout was performed prior to the initiation of the procedure. After creating a small venotomy incision, a micropuncture kit was utilized to access  the internal jugular vein. Real-time ultrasound guidance was utilized for vascular access including the acquisition of a permanent ultrasound image documenting patency of the accessed vessel. The microwire was utilized to measure appropriate catheter length. A stiff Glidewire was advanced to the level of the IVC and the micropuncture sheath was exchanged for a peel-away sheath. A palindrome tunneled hemodialysis catheter measuring 23 cm from tip to cuff was tunneled in a retrograde fashion from the anterior chest wall to the venotomy incision. The catheter was then placed through the peel-away sheath with tips ultimately positioned within the superior aspect of the right atrium. Final catheter positioning was confirmed and documented with a spot radiographic image. The catheter aspirates and flushes normally. The catheter was flushed with appropriate volume heparin dwells. The catheter exit site was secured with a 0-Silk retention suture. The venotomy incision was closed wit Dermabond. Dressings were applied. The patient tolerated the procedure well without immediate post procedural complication. IMPRESSION: Successful placement of 23 cm tip to cuff tunneled hemodialysis catheter via the RIGHT internal jugular vein, as above. The tip of the catheter is positioned within the proximal RIGHT atrium. The catheter is ready for immediate use. Michaelle Birks, MD [facility]  Radiology Electronically Signed   By: Michaelle Birks M.D.   On: 01/21/2022 13:48  ? ?IR US Guide Vasc Access Right ? ?Result Date: 01/21/2022 ?INDICATION: AKI. ESRD requiring HD. EXAM: TUNNELED CENTRAL VENOUS HEMODIALYSIS CATHETER PLACEMENT WITH ULTRASOUND AND FLUOROSCOPIC GUIDANCE MEDICATIONS: Ancef 2 gm IV . The antibiotic was given in an appropriate time interval prior to skin puncture. ANESTHESIA/SEDATION: Moderate (conscious) sedation was employed during this procedure. A total of Versed 1 mg and Fentanyl  50 mcg was administered intravenously. Moderate Sedation Time: 14 minutes. The patient's level of consciousness and vital signs were monitored continuously by radiology nursing throughout the procedure under my di

## 2022-01-25 NOTE — Progress Notes (Addendum)
?  [facility] ?Progress Note  ? ?Assessment/ Plan:   ?1. 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  Now status post 3 sessions of dialysis.  Plan to maintain TTS schedule.  Holding off on AVF creation due to the patient's desire for peritoneal dialysis; discussed having AVF as backup but he's more interested in getting out of the hosp at this time.  ? ?Called renal navigator and she is working on placement at [facility] to help facilitate -> just notified that he is accepted at [facility] to start tomorrow (Tues) ? ?2. Anemia: Iron levels normal and hemoglobin low but stable.  Aranesp added. Transfuse if Hgb <7. ? ?3. CKD-MBD: PTH significantly elevated, low calcium, low vitamin D.  Started calcitriol this admission.  Phosphorus has improved with dialysis -> 5's now not on a binder. ? ?4. Hypertension: BP remains high after HD. Started losartan 25mg  daily given he is ESRD ? ?5. Chronic diastolic HF: management per primary. Will optimize volume status with HD. ? ?6.  Dispo: Working on placement at [facility]; hopefully early this week ? ? ?Subjective:   ? ?Patient feels well today with no complaints.  Tolerated dialysis Sat with no issues. He is anxious to leave the hospital.  ?  ? ?Objective:   ?BP (!) 145/96 (BP Location: Right Arm)   Pulse 91   Temp 98.3 ?F (36.8 ?C) (Oral)   Resp 19   Ht 6' 3.98" (1.93 m)   Wt 116.1 kg   SpO2 100%   BMI 31.16 kg/m?  ? ?Physical Exam: ?Gen: Lying in bed, no distress,  ?CV: Normal rate, no audible rub ?Pulm: Bilateral chest rise with no increased work of breathing ?Abd: soft, nontender, nondistended, normoactive bowel sounds. ?Extremities: trace BLE edema, warm and well perfused ?Neuro: AAOx3, no focal deficits noted. ?Access: RIJ TC ? ?Labs: ?BMET ?Recent Labs  ?Lab 01/19/22 ?1730 01/20/22 ?0412 01/21/22 ?0710 01/22/22 ?1660 01/23/22 ?0406 01/24/22 ?0414 01/25/22 ?0230  ?NA 143 141 143 139 142  141 141  ?K 4.2 4.2 4.0 4.0 4.3 3.2* 3.5  ?CL 111 112* 114* 109 110 107 109  ?CO2 15* 17* 18* 21* 26 28 25   ?GLUCOSE 117* 104* 94 105* 96 87 114*  ?BUN 95* 93* 102* 73* 50* 30* 37*  ?CREATININE 14.45* 13.71* 14.28* 10.92* 8.63* 6.89* 8.44*  ?CALCIUM 7.5* 6.7* 6.8* 6.8* 7.1* 7.3* 7.4*  ?PHOS  --  11.2* 11.2* 8.1* 6.3* 5.5* 5.4*  ? ?CBC ?Recent Labs  ?Lab 01/19/22 ?1730 01/20/22 ?0412 01/21/22 ?0710  ?WBC 8.9 5.8 7.1  ?NEUTROABS 5.8  --   --   ?HGB 8.9* 7.6* 8.3*  ?HCT 26.9* 22.8* 24.7*  ?MCV 96.4 96.6 94.6  ?PLT 294 260 278  ? ? ?  ?Medications:   ? ? calcitRIOL  0.5 mcg Oral Q T,Th,Sa-HD  ? Chlorhexidine Gluconate Cloth  6 each Topical Q0600  ? darbepoetin (ARANESP) injection - DIALYSIS  60 mcg Intravenous Q Fri-HD  ? losartan  25 mg Oral Daily  ? pantoprazole  40 mg Oral Daily  ? predniSONE  5 mg Oral Q breakfast  ? sevelamer carbonate  800 mg Oral TID WC  ? ? ?Dwana Melena ? ?01/25/2022, 9:20 AM  ? ?

## 2022-01-25 NOTE — TOC Transition Note (Signed)
Transition of Care (TOC) - CM/SW Discharge Note ? ? ?Patient Details  ?Name: Cameron Peterson ?MRN: [id number] ?Date of Birth: Nov 23, 1965 ? ?Transition of Care (TOC) CM/SW Contact:  ?Zenon Mayo, RN ?Phone Number: ?01/25/2022, 10:48 AM ? ? ?Clinical Narrative:    ?Patient is for dc today, has been clipped.  He has no other needs.  ? ? ?  ?  ? ? ?Patient Goals and CMS Choice ?  ?  ?  ? ?Discharge Placement ?  ?           ?  ?  ?  ?  ? ?Discharge Plan and Services ?  ?  ?           ?  ?  ?  ?  ?  ?  ?  ?  ?  ?  ? ?Social Determinants of Health (SDOH) Interventions ?  ? ? ?Readmission Risk Interventions ?   ? View : No data to display.  ?  ?  ?  ? ? ? ? ? ?

## 2022-01-26 DIAGNOSIS — N2581 Secondary hyperparathyroidism of renal origin: Secondary | ICD-10-CM | POA: Insufficient documentation

## 2022-02-09 ENCOUNTER — Ambulatory Visit (HOSPITAL_COMMUNITY)
Admission: RE | Admit: 2022-02-09 | Discharge: 2022-02-09 | Disposition: A | Discharge status: Home / Self Care | Payer: 59 | Source: Ambulatory Visit | Attending: Vascular Surgery | Admitting: Vascular Surgery

## 2022-02-09 ENCOUNTER — Ambulatory Visit (INDEPENDENT_AMBULATORY_CARE_PROVIDER_SITE_OTHER)
Admission: RE | Admit: 2022-02-09 | Discharge: 2022-02-09 | Disposition: A | Discharge status: Home / Self Care | Payer: 59 | Source: Ambulatory Visit | Attending: Vascular Surgery | Admitting: Vascular Surgery

## 2022-02-09 ENCOUNTER — Other Ambulatory Visit: Payer: Self-pay

## 2022-02-09 DIAGNOSIS — I10 Essential (primary) hypertension: Secondary | ICD-10-CM | POA: Insufficient documentation

## 2022-02-09 DIAGNOSIS — N179 Acute kidney failure, unspecified: Secondary | ICD-10-CM

## 2022-02-09 DIAGNOSIS — I770 Arteriovenous fistula, acquired: Secondary | ICD-10-CM | POA: Diagnosis not present

## 2022-02-10 NOTE — H&P (View-Only) (Signed)
[facility] [place] ? ?ASSESSMENT / PLAN: ?Cameron Peterson is a 56 y.o. right handed male in need of permanent dialysis access. I reviewed options for dialysis in detail with the patient, including hemodialysis and peritoneal dialysis. I counseled the patient that dialysis access requires surveillance and periodic maintenance. Plan to proceed with left brachiocephalic arteriovenous fistula as the schedule allows.  ? ?CHIEF COMPLAINT: ESRD ? ?HISTORY OF PRESENT ILLNESS: ?Cameron Peterson is a 56 y.o. male with ESRD in need of permanent dialysis access.  He has been dialyzing through a right IJ tunneled dialysis catheter for the past several weeks.  He is tolerated dialysis fairly well.  He is right-handed.  He has never had dialysis access before.  We spent the bulk of our visit discussing the options for dialysis access.  We briefly discussed access related hand ischemia.  He wishes to proceed with hemodialysis access surgery. ? ?Past Medical History:  ?Diagnosis Date  ? Acute diastolic CHF (congestive heart failure), NYHA class 1 ([place]) 02/04/2015  ? Hypertension   ? ? ?Past Surgical History:  ?Procedure Laterality Date  ? IR FLUORO GUIDE CV LINE RIGHT  01/21/2022  ? IR US GUIDE VASC ACCESS RIGHT  01/21/2022  ? ? ?Family History  ?Problem Relation Age of Onset  ? Diabetes Mellitus II Father   ? ? ?Social History  ? ?Socioeconomic History  ? Marital status: Married  ?  Spouse name: Not on file  ? Number of children: Not on file  ? Years of education: Not on file  ? Highest education level: Not on file  ?Occupational History  ? Not on file  ?Tobacco Use  ? Smoking status: Never  ? Smokeless tobacco: Not on file  ?Substance and Sexual Activity  ? Alcohol use: Yes  ?  Comment: 1 pint of liquor a week  ? Drug use: No  ? Sexual activity: Yes  ?Other Topics Concern  ? Not on file  ?Social History Narrative  ? Not on file  ? ?Social Determinants of Health  ? ?Financial Resource Strain: Not on file  ?Food  Insecurity: Not on file  ?Transportation Needs: Not on file  ?Physical Activity: Not on file  ?Stress: Not on file  ?Social Connections: Not on file  ?Intimate Partner Violence: Not on file  ? ? ?Allergies  ?Allergen Reactions  ? Lisinopril Swelling  ? Shellfish Allergy Swelling  ? ? ?Current Outpatient Medications  ?Medication Sig Dispense Refill  ? calcitRIOL (ROCALTROL) 0.5 MCG capsule Take 1 capsule (0.5 mcg total) by mouth Every Tuesday,Thursday,and Saturday with dialysis. 12 capsule 0  ? carvedilol (COREG) 3.125 MG tablet Take 1 tablet (3.125 mg total) by mouth 2 (two) times daily with a meal. 60 tablet 1  ? losartan (COZAAR) 25 MG tablet Take 2 tablets (50 mg total) by mouth daily. 60 tablet 0  ? pantoprazole (PROTONIX) 40 MG tablet Take 40 mg by mouth daily.    ? predniSONE (DELTASONE) 10 MG tablet Take 10-60 mg by mouth See admin instructions. 60mg  for days 1-3, 50mg  days 4-6, 40mg  days 7-9, 30mg  days 10-12, 20mg  days 13-15, 10mg  days 16-18. Pt is on day 4    ? sevelamer carbonate (RENVELA) 800 MG tablet Take 1 tablet (800 mg total) by mouth 3 (three) times daily with meals. 90 tablet 0  ? Tetrahydrozoline HCl (VISINE OP) Place 1 drop into both eyes daily as needed (dry eyes).    ? ?No current facility-administered medications for this visit.  ? ? ?  PHYSICAL EXAM ?Vitals:  ? 02/11/22 1602  ?BP: 125/86  ?Pulse: 87  ?Resp: 18  ?Temp: 98.7 ?F (37.1 ?C)  ?TempSrc: Temporal  ?SpO2: 99%  ?Weight: 251 lb (113.9 kg)  ?Height: 6\' 3"  (1.905 m)  ? ? ?Constitutional: Well-appearing man in no acute distress ?Cardiac: regular rate and rhythm.  ?Respiratory:  unlabored. ?Abdominal:  soft, non-tender, non-distended.  ?Peripheral vascular: 2+ radial pulses bilaterally ? ?PERTINENT LABORATORY AND RADIOLOGIC DATA ? ?Most recent CBC ? ?  Latest Ref Rng & Units 01/21/2022  ?  7:10 AM 01/20/2022  ?  4:12 AM 01/19/2022  ?  5:30 PM  ?CBC  ?WBC 4.0 - 10.5 K/uL 7.1   5.8   8.9    ?Hemoglobin 13.0 - 17.0 g/dL 8.3   7.6   8.9     ?Hematocrit 39.0 - 52.0 % 24.7   22.8   26.9    ?Platelets 150 - 400 K/uL 278   260   294    ?  ? ?Most recent CMP ? ?  Latest Ref Rng & Units 01/25/2022  ?  2:30 AM 01/24/2022  ?  4:14 AM 01/23/2022  ?  4:06 AM  ?CMP  ?Glucose 70 - 99 mg/dL 114   87   96    ?BUN 6 - 20 mg/dL 37   30   50    ?Creatinine 0.61 - 1.24 mg/dL 8.44   6.89   8.63    ?Sodium 135 - 145 mmol/L 141   141   142    ?Potassium 3.5 - 5.1 mmol/L 3.5   3.2   4.3    ?Chloride 98 - 111 mmol/L 109   107   110    ?CO2 22 - 32 mmol/L 25   28   26     ?Calcium 8.9 - 10.3 mg/dL 7.4   7.3   7.1    ? ?Preoperative vein mapping suggest high quality left cephalic vein. ? ?Cameron Peterson. Stanford Breed, MD ?[facility] [place] ?Office Phone Number: [phone] ?02/11/2022 4:24 PM ? ?Total time spent on preparing this encounter including chart review, data review, collecting history, examining the patient, coordinating care for this new patient, 60 minutes. ? ?Portions of this report may have been transcribed using voice recognition software.  Every effort has been made to ensure accuracy; however, inadvertent computerized transcription errors may still be present. ? ? ? ?

## 2022-02-10 NOTE — Progress Notes (Signed)
[facility] [place] ? ?ASSESSMENT / PLAN: ?Cameron Peterson is a 56 y.o. right handed male in need of permanent dialysis access. I reviewed options for dialysis in detail with the patient, including hemodialysis and peritoneal dialysis. I counseled the patient that dialysis access requires surveillance and periodic maintenance. Plan to proceed with left brachiocephalic arteriovenous fistula as the schedule allows.  ? ?CHIEF COMPLAINT: ESRD ? ?HISTORY OF PRESENT ILLNESS: ?Cameron Peterson is a 56 y.o. male with ESRD in need of permanent dialysis access.  He has been dialyzing through a right IJ tunneled dialysis catheter for the past several weeks.  He is tolerated dialysis fairly well.  He is right-handed.  He has never had dialysis access before.  We spent the bulk of our visit discussing the options for dialysis access.  We briefly discussed access related hand ischemia.  He wishes to proceed with hemodialysis access surgery. ? ?Past Medical History:  ?Diagnosis Date  ? Acute diastolic CHF (congestive heart failure), NYHA class 1 ([place]) 02/04/2015  ? Hypertension   ? ? ?Past Surgical History:  ?Procedure Laterality Date  ? IR FLUORO GUIDE CV LINE RIGHT  01/21/2022  ? IR US GUIDE VASC ACCESS RIGHT  01/21/2022  ? ? ?Family History  ?Problem Relation Age of Onset  ? Diabetes Mellitus II Father   ? ? ?Social History  ? ?Socioeconomic History  ? Marital status: Married  ?  Spouse name: Not on file  ? Number of children: Not on file  ? Years of education: Not on file  ? Highest education level: Not on file  ?Occupational History  ? Not on file  ?Tobacco Use  ? Smoking status: Never  ? Smokeless tobacco: Not on file  ?Substance and Sexual Activity  ? Alcohol use: Yes  ?  Comment: 1 pint of liquor a week  ? Drug use: No  ? Sexual activity: Yes  ?Other Topics Concern  ? Not on file  ?Social History Narrative  ? Not on file  ? ?Social Determinants of Health  ? ?Financial Resource Strain: Not on file  ?Food  Insecurity: Not on file  ?Transportation Needs: Not on file  ?Physical Activity: Not on file  ?Stress: Not on file  ?Social Connections: Not on file  ?Intimate Partner Violence: Not on file  ? ? ?Allergies  ?Allergen Reactions  ? Lisinopril Swelling  ? Shellfish Allergy Swelling  ? ? ?Current Outpatient Medications  ?Medication Sig Dispense Refill  ? calcitRIOL (ROCALTROL) 0.5 MCG capsule Take 1 capsule (0.5 mcg total) by mouth Every Tuesday,Thursday,and Saturday with dialysis. 12 capsule 0  ? carvedilol (COREG) 3.125 MG tablet Take 1 tablet (3.125 mg total) by mouth 2 (two) times daily with a meal. 60 tablet 1  ? losartan (COZAAR) 25 MG tablet Take 2 tablets (50 mg total) by mouth daily. 60 tablet 0  ? pantoprazole (PROTONIX) 40 MG tablet Take 40 mg by mouth daily.    ? predniSONE (DELTASONE) 10 MG tablet Take 10-60 mg by mouth See admin instructions. 60mg  for days 1-3, 50mg  days 4-6, 40mg  days 7-9, 30mg  days 10-12, 20mg  days 13-15, 10mg  days 16-18. Pt is on day 4    ? sevelamer carbonate (RENVELA) 800 MG tablet Take 1 tablet (800 mg total) by mouth 3 (three) times daily with meals. 90 tablet 0  ? Tetrahydrozoline HCl (VISINE OP) Place 1 drop into both eyes daily as needed (dry eyes).    ? ?No current facility-administered medications for this visit.  ? ? ?  PHYSICAL EXAM ?Vitals:  ? 02/11/22 1602  ?BP: 125/86  ?Pulse: 87  ?Resp: 18  ?Temp: 98.7 ?F (37.1 ?C)  ?TempSrc: Temporal  ?SpO2: 99%  ?Weight: 251 lb (113.9 kg)  ?Height: 6\' 3"  (1.905 m)  ? ? ?Constitutional: Well-appearing man in no acute distress ?Cardiac: regular rate and rhythm.  ?Respiratory:  unlabored. ?Abdominal:  soft, non-tender, non-distended.  ?Peripheral vascular: 2+ radial pulses bilaterally ? ?PERTINENT LABORATORY AND RADIOLOGIC DATA ? ?Most recent CBC ? ?  Latest Ref Rng & Units 01/21/2022  ?  7:10 AM 01/20/2022  ?  4:12 AM 01/19/2022  ?  5:30 PM  ?CBC  ?WBC 4.0 - 10.5 K/uL 7.1   5.8   8.9    ?Hemoglobin 13.0 - 17.0 g/dL 8.3   7.6   8.9     ?Hematocrit 39.0 - 52.0 % 24.7   22.8   26.9    ?Platelets 150 - 400 K/uL 278   260   294    ?  ? ?Most recent CMP ? ?  Latest Ref Rng & Units 01/25/2022  ?  2:30 AM 01/24/2022  ?  4:14 AM 01/23/2022  ?  4:06 AM  ?CMP  ?Glucose 70 - 99 mg/dL 114   87   96    ?BUN 6 - 20 mg/dL 37   30   50    ?Creatinine 0.61 - 1.24 mg/dL 8.44   6.89   8.63    ?Sodium 135 - 145 mmol/L 141   141   142    ?Potassium 3.5 - 5.1 mmol/L 3.5   3.2   4.3    ?Chloride 98 - 111 mmol/L 109   107   110    ?CO2 22 - 32 mmol/L 25   28   26     ?Calcium 8.9 - 10.3 mg/dL 7.4   7.3   7.1    ? ?Preoperative vein mapping suggest high quality left cephalic vein. ? ?[name]. Stanford Breed, MD ?[facility] [place] ?Office Phone Number: [phone] ?02/11/2022 4:24 PM ? ?Total time spent on preparing this encounter including chart review, data review, collecting history, examining the patient, coordinating care for this new patient, 60 minutes. ? ?Portions of this report may have been transcribed using voice recognition software.  Every effort has been made to ensure accuracy; however, inadvertent computerized transcription errors may still be present. ? ? ? ?

## 2022-02-11 ENCOUNTER — Ambulatory Visit: Payer: 59 | Admitting: Vascular Surgery

## 2022-02-11 ENCOUNTER — Encounter: Payer: Self-pay | Admitting: Vascular Surgery

## 2022-02-11 VITALS — BP 125/86 | HR 87 | Temp 98.7°F | Resp 18 | Ht 75.0 in | Wt 251.0 lb

## 2022-02-11 DIAGNOSIS — Z992 Dependence on renal dialysis: Secondary | ICD-10-CM

## 2022-02-11 DIAGNOSIS — N186 End stage renal disease: Secondary | ICD-10-CM

## 2022-02-12 ENCOUNTER — Other Ambulatory Visit: Payer: Self-pay

## 2022-02-12 ENCOUNTER — Encounter (HOSPITAL_COMMUNITY): Payer: Self-pay | Admitting: Vascular Surgery

## 2022-02-12 NOTE — Progress Notes (Signed)
Spoke with pt for pre-op call. Pt states he's had CHF and is treated for HTN. He recently started on dialysis for CKD. He states he does dialysis on M/Tu/Th/Fr at this time. Pt states he is not diabetic.  ? ?Shower instructions given to pt.  ?

## 2022-02-14 NOTE — Anesthesia Preprocedure Evaluation (Addendum)
Anesthesia Evaluation  ?Patient identified by MRN, date of birth, ID band ?Patient awake ? ? ? ?Reviewed: ?Allergy & Precautions, NPO status , Patient's Chart, lab work & pertinent test results ? ?Airway ?Mallampati: II ? ?TM Distance: >3 FB ?Neck ROM: Full ? ? ? Dental ?no notable dental hx. ?(+) Teeth Intact, Dental Advisory Given ?  ?Pulmonary ?neg pulmonary ROS,  ?  ?Pulmonary exam normal ?breath sounds clear to auscultation ? ? ? ? ? ? Cardiovascular ?Exercise Tolerance: Good ?hypertension, Pt. on medications and Pt. on home beta blockers ?+CHF  ?negative cardio ROS ?Normal cardiovascular exam ?Rhythm:Regular Rate:Normal ? ? ?  ?Neuro/Psych ?negative neurological ROS ? negative psych ROS  ? GI/Hepatic ?negative GI ROS, Neg liver ROS, GERD  ,  ?Endo/Other  ?negative endocrine ROS ? Renal/GU ?Renal Insufficiency and DialysisRenal disease (dialyzed on friday)  ?negative genitourinary ?  ?Musculoskeletal ?negative musculoskeletal ROS ?(+) Arthritis ,  ? Abdominal ?  ?Peds ?negative pediatric ROS ?(+)  Hematology ? ?(+) Blood dyscrasia, anemia ,   ?Anesthesia Other Findings ? ? Reproductive/Obstetrics ?negative OB ROS ? ?  ? ? ? ? ? ? ? ? ? ? ? ? ? ?  ?  ? ? ? ? ? ? ?Anesthesia Physical ?Anesthesia Plan ? ?ASA: 4 ? ?Anesthesia Plan: MAC and Regional  ? ?Post-op Pain Management: Tylenol PO (pre-op)* and Minimal or no pain anticipated  ? ?Induction: Intravenous ? ?PONV Risk Score and Plan: 1 and Treatment may vary due to age or medical condition, Ondansetron, Dexamethasone and Propofol infusion ? ?Airway Management Planned: Natural Airway and Simple Face Mask ? ?Additional Equipment: None ? ?Intra-op Plan:  ? ?Post-operative Plan:  ? ?Informed Consent: I have reviewed the patients History and Physical, chart, labs and discussed the procedure including the risks, benefits and alternatives for the proposed anesthesia with the patient or authorized representative who has indicated  his/her understanding and acceptance.  ? ? ? ?Dental advisory given ? ?Plan Discussed with: CRNA, Anesthesiologist and Surgeon ? ?Anesthesia Plan Comments:   ? ? ? ? ? ?Anesthesia Quick Evaluation ? ?

## 2022-02-15 ENCOUNTER — Ambulatory Visit (HOSPITAL_COMMUNITY)
Admission: RE | Admit: 2022-02-15 | Discharge: 2022-02-15 | Disposition: A | Discharge status: Home / Self Care | Payer: 59 | Attending: Vascular Surgery | Admitting: Vascular Surgery

## 2022-02-15 ENCOUNTER — Ambulatory Visit (HOSPITAL_BASED_OUTPATIENT_CLINIC_OR_DEPARTMENT_OTHER): Payer: 59 | Admitting: Anesthesiology

## 2022-02-15 ENCOUNTER — Encounter (HOSPITAL_COMMUNITY)
Admission: RE | Disposition: A | Discharge status: Home / Self Care | Payer: Self-pay | Source: Home / Self Care | Attending: Vascular Surgery

## 2022-02-15 ENCOUNTER — Ambulatory Visit (HOSPITAL_COMMUNITY): Payer: 59 | Admitting: Anesthesiology

## 2022-02-15 ENCOUNTER — Encounter (HOSPITAL_COMMUNITY): Payer: Self-pay | Admitting: Vascular Surgery

## 2022-02-15 ENCOUNTER — Other Ambulatory Visit: Payer: Self-pay

## 2022-02-15 DIAGNOSIS — I120 Hypertensive chronic kidney disease with stage 5 chronic kidney disease or end stage renal disease: Secondary | ICD-10-CM | POA: Insufficient documentation

## 2022-02-15 DIAGNOSIS — N186 End stage renal disease: Secondary | ICD-10-CM | POA: Diagnosis not present

## 2022-02-15 DIAGNOSIS — I132 Hypertensive heart and chronic kidney disease with heart failure and with stage 5 chronic kidney disease, or end stage renal disease: Secondary | ICD-10-CM | POA: Diagnosis not present

## 2022-02-15 DIAGNOSIS — Z992 Dependence on renal dialysis: Secondary | ICD-10-CM | POA: Insufficient documentation

## 2022-02-15 DIAGNOSIS — I509 Heart failure, unspecified: Secondary | ICD-10-CM

## 2022-02-15 DIAGNOSIS — N185 Chronic kidney disease, stage 5: Secondary | ICD-10-CM | POA: Diagnosis not present

## 2022-02-15 HISTORY — DX: Gastro-esophageal reflux disease without esophagitis: K21.9

## 2022-02-15 HISTORY — DX: Unspecified osteoarthritis, unspecified site: M19.90

## 2022-02-15 HISTORY — DX: Chronic kidney disease, unspecified: N18.9

## 2022-02-15 HISTORY — PX: AV FISTULA PLACEMENT: SHX1204

## 2022-02-15 LAB — POCT I-STAT, CHEM 8
BUN: 37 mg/dL — ABNORMAL HIGH (ref 6–20)
Calcium, Ion: 1.09 mmol/L — ABNORMAL LOW (ref 1.15–1.40)
Chloride: 110 mmol/L (ref 98–111)
Creatinine, Ser: 10.9 mg/dL — ABNORMAL HIGH (ref 0.61–1.24)
Glucose, Bld: 89 mg/dL (ref 70–99)
HCT: 29 % — ABNORMAL LOW (ref 39.0–52.0)
Hemoglobin: 9.9 g/dL — ABNORMAL LOW (ref 13.0–17.0)
Potassium: 3.5 mmol/L (ref 3.5–5.1)
Sodium: 144 mmol/L (ref 135–145)
TCO2: 25 mmol/L (ref 22–32)

## 2022-02-15 SURGERY — ARTERIOVENOUS (AV) FISTULA CREATION
Anesthesia: Monitor Anesthesia Care | Site: Arm Upper | Laterality: Left

## 2022-02-15 MED ORDER — HEPARIN SODIUM (PORCINE) 1000 UNIT/ML IJ SOLN
INTRAMUSCULAR | Status: AC
  Filled 2022-02-15: qty 10

## 2022-02-15 MED ORDER — ORAL CARE MOUTH RINSE: 15.0000 mL | Freq: Once | OROMUCOSAL | Status: AC

## 2022-02-15 MED ORDER — CHLORHEXIDINE GLUCONATE 4 % EX LIQD: 60.0000 mL | Freq: Once | CUTANEOUS | Status: DC

## 2022-02-15 MED ORDER — DEXAMETHASONE SODIUM PHOSPHATE 10 MG/ML IJ SOLN
INTRAMUSCULAR | Status: AC
  Filled 2022-02-15: qty 1

## 2022-02-15 MED ORDER — AMISULPRIDE (ANTIEMETIC) 5 MG/2ML IV SOLN: 10.0000 mg | Freq: Once | INTRAVENOUS | Status: DC | PRN

## 2022-02-15 MED ORDER — CHLORHEXIDINE GLUCONATE 0.12 % MT SOLN
15.0000 mL | Freq: Once | OROMUCOSAL | Status: AC
  Administered 2022-02-15: 15 mL via OROMUCOSAL
  Filled 2022-02-15: qty 15

## 2022-02-15 MED ORDER — PROPOFOL 10 MG/ML IV BOLUS
INTRAVENOUS | Status: AC
  Filled 2022-02-15: qty 20

## 2022-02-15 MED ORDER — SODIUM CHLORIDE 0.9 % IV SOLN: INTRAVENOUS | Status: DC

## 2022-02-15 MED ORDER — ACETAMINOPHEN 500 MG PO TABS
1000.0000 mg | ORAL_TABLET | Freq: Once | ORAL | Status: AC
  Administered 2022-02-15: 1000 mg via ORAL
  Filled 2022-02-15: qty 2

## 2022-02-15 MED ORDER — ONDANSETRON HCL 4 MG/2ML IJ SOLN
INTRAMUSCULAR | Status: DC | PRN
  Administered 2022-02-15: 4 mg via INTRAVENOUS

## 2022-02-15 MED ORDER — FENTANYL CITRATE (PF) 250 MCG/5ML IJ SOLN
INTRAMUSCULAR | Status: DC | PRN
  Administered 2022-02-15: 50 ug via INTRAVENOUS

## 2022-02-15 MED ORDER — LIDOCAINE 2% (20 MG/ML) 5 ML SYRINGE
INTRAMUSCULAR | Status: AC
  Filled 2022-02-15: qty 5

## 2022-02-15 MED ORDER — LIDOCAINE-EPINEPHRINE (PF) 1 %-1:200000 IJ SOLN
INTRAMUSCULAR | Status: DC | PRN
  Administered 2022-02-15: .00001 mL

## 2022-02-15 MED ORDER — DEXAMETHASONE SODIUM PHOSPHATE 10 MG/ML IJ SOLN
INTRAMUSCULAR | Status: DC | PRN
  Administered 2022-02-15: 4 mg via INTRAVENOUS

## 2022-02-15 MED ORDER — LIDOCAINE HCL (PF) 1 % IJ SOLN
INTRAMUSCULAR | Status: AC
  Filled 2022-02-15: qty 30

## 2022-02-15 MED ORDER — MIDAZOLAM HCL 2 MG/2ML IJ SOLN
INTRAMUSCULAR | Status: DC | PRN
  Administered 2022-02-15: 2 mg via INTRAVENOUS

## 2022-02-15 MED ORDER — 0.9 % SODIUM CHLORIDE (POUR BTL) OPTIME
TOPICAL | Status: DC | PRN
  Administered 2022-02-15: 1000 mL

## 2022-02-15 MED ORDER — CARVEDILOL 3.125 MG PO TABS: 3.1250 mg | ORAL_TABLET | Freq: Once | ORAL | Status: AC

## 2022-02-15 MED ORDER — PROPOFOL 500 MG/50ML IV EMUL
INTRAVENOUS | Status: DC | PRN
  Administered 2022-02-15: 75 ug/kg/min via INTRAVENOUS

## 2022-02-15 MED ORDER — FENTANYL CITRATE (PF) 100 MCG/2ML IJ SOLN: 25.0000 ug | INTRAMUSCULAR | Status: DC | PRN

## 2022-02-15 MED ORDER — HYDROCODONE-ACETAMINOPHEN 5-325 MG PO TABS: 1.0000 | ORAL_TABLET | Freq: Four times a day (QID) | ORAL | 0 refills | Status: DC | PRN

## 2022-02-15 MED ORDER — CEFAZOLIN SODIUM-DEXTROSE 2-4 GM/100ML-% IV SOLN
2.0000 g | INTRAVENOUS | Status: AC
  Administered 2022-02-15: 2 g via INTRAVENOUS
  Filled 2022-02-15: qty 100

## 2022-02-15 MED ORDER — ONDANSETRON HCL 4 MG/2ML IJ SOLN
INTRAMUSCULAR | Status: AC
  Filled 2022-02-15: qty 2

## 2022-02-15 MED ORDER — HEPARIN 6000 UNIT IRRIGATION SOLUTION
Status: DC | PRN
  Administered 2022-02-15: 1

## 2022-02-15 MED ORDER — CARVEDILOL 3.125 MG PO TABS
ORAL_TABLET | ORAL | Status: AC
  Administered 2022-02-15: 3.125 mg via ORAL
  Filled 2022-02-15: qty 1

## 2022-02-15 MED ORDER — HEPARIN 6000 UNIT IRRIGATION SOLUTION
Status: AC
  Filled 2022-02-15: qty 500

## 2022-02-15 MED ORDER — ONDANSETRON HCL 4 MG/2ML IJ SOLN: 4.0000 mg | Freq: Once | INTRAMUSCULAR | Status: DC | PRN

## 2022-02-15 MED ORDER — ROPIVACAINE HCL 5 MG/ML IJ SOLN
INTRAMUSCULAR | Status: DC | PRN
  Administered 2022-02-15: 30 mL via PERINEURAL

## 2022-02-15 MED ORDER — MIDAZOLAM HCL 2 MG/2ML IJ SOLN
INTRAMUSCULAR | Status: AC
  Filled 2022-02-15: qty 2

## 2022-02-15 MED ORDER — SODIUM CHLORIDE 0.9 % IV SOLN: INTRAVENOUS | Status: DC | PRN

## 2022-02-15 MED ORDER — FENTANYL CITRATE (PF) 250 MCG/5ML IJ SOLN
INTRAMUSCULAR | Status: AC
  Filled 2022-02-15: qty 5

## 2022-02-15 SURGICAL SUPPLY — 37 items
ARMBAND PINK RESTRICT EXTREMIT (MISCELLANEOUS) ×2 IMPLANT
BENZOIN TINCTURE PRP APPL 2/3 (GAUZE/BANDAGES/DRESSINGS) ×1 IMPLANT
CANISTER SUCT 3000ML PPV (MISCELLANEOUS) ×2 IMPLANT
CANNULA VESSEL 3MM 2 BLNT TIP (CANNULA) ×1 IMPLANT
CHLORAPREP W/TINT 26 (MISCELLANEOUS) ×2 IMPLANT
CLIP LIGATING EXTRA MED SLVR (CLIP) ×2 IMPLANT
CLIP LIGATING EXTRA SM BLUE (MISCELLANEOUS) ×2 IMPLANT
COVER PROBE W GEL 5X96 (DRAPES) ×1 IMPLANT
DERMABOND ADVANCED (GAUZE/BANDAGES/DRESSINGS) ×1
DERMABOND ADVANCED .7 DNX12 (GAUZE/BANDAGES/DRESSINGS) IMPLANT
ELECT REM PT RETURN 9FT ADLT (ELECTROSURGICAL) ×2
ELECTRODE REM PT RTRN 9FT ADLT (ELECTROSURGICAL) ×1 IMPLANT
GLOVE SURG SS PI 8.0 STRL IVOR (GLOVE) ×2 IMPLANT
GOWN STRL REUS W/ TWL LRG LVL3 (GOWN DISPOSABLE) ×2 IMPLANT
GOWN STRL REUS W/ TWL XL LVL3 (GOWN DISPOSABLE) ×1 IMPLANT
GOWN STRL REUS W/TWL LRG LVL3 (GOWN DISPOSABLE) ×2
GOWN STRL REUS W/TWL XL LVL3 (GOWN DISPOSABLE) ×1
INSERT FOGARTY SM (MISCELLANEOUS) IMPLANT
KIT BASIN OR (CUSTOM PROCEDURE TRAY) ×2 IMPLANT
KIT TURNOVER KIT B (KITS) ×2 IMPLANT
LOOP VESSEL MINI RED (MISCELLANEOUS) ×1 IMPLANT
NDL 18GX1X1/2 (RX/OR ONLY) (NEEDLE) IMPLANT
NEEDLE 18GX1X1/2 (RX/OR ONLY) (NEEDLE) IMPLANT
NS IRRIG 1000ML POUR BTL (IV SOLUTION) ×2 IMPLANT
PACK CV ACCESS (CUSTOM PROCEDURE TRAY) ×2 IMPLANT
PAD ARMBOARD 7.5X6 YLW CONV (MISCELLANEOUS) ×4 IMPLANT
SLING ARM FOAM STRAP LRG (SOFTGOODS) IMPLANT
SLING ARM FOAM STRAP MED (SOFTGOODS) IMPLANT
STRIP CLOSURE SKIN 1/2X4 (GAUZE/BANDAGES/DRESSINGS) ×1 IMPLANT
SUT MNCRL AB 4-0 PS2 18 (SUTURE) ×2 IMPLANT
SUT PROLENE 6 0 BV (SUTURE) ×2 IMPLANT
SUT VIC AB 3-0 SH 27 (SUTURE) ×1
SUT VIC AB 3-0 SH 27X BRD (SUTURE) ×1 IMPLANT
SYR 3ML LL SCALE MARK (SYRINGE) IMPLANT
TOWEL GREEN STERILE (TOWEL DISPOSABLE) ×2 IMPLANT
UNDERPAD 30X36 HEAVY ABSORB (UNDERPADS AND DIAPERS) ×2 IMPLANT
WATER STERILE IRR 1000ML POUR (IV SOLUTION) ×2 IMPLANT

## 2022-02-15 NOTE — Anesthesia Postprocedure Evaluation (Signed)
Anesthesia Post Note ? ?Patient: Cameron Peterson ? ?Procedure(s) Performed: LEFT BRACHIOCEPHALIC ARTERIOVENOUS (AV) FISTULA CREATION (Left: Arm Upper) ? ?  ? ?Patient location during evaluation: PACU ?Anesthesia Type: Regional ?Level of consciousness: awake ?Pain management: pain level controlled ?Vital Signs Assessment: post-procedure vital signs reviewed and stable ?Respiratory status: spontaneous breathing and respiratory function stable ?Cardiovascular status: stable ?Postop Assessment: no apparent nausea or vomiting ?Anesthetic complications: no ? ? ?No notable events documented. ? ?Last Vitals:  ?Vitals:  ? 02/15/22 0845 02/15/22 0902  ?BP: (!) 156/88 (!) 146/95  ?Pulse: 80 64  ?Resp: 17 12  ?Temp: 36.5 ?C   ?SpO2: 95% 96%  ?  ?Last Pain:  ?Vitals:  ? 02/15/22 0845  ?TempSrc:   ?PainSc: 0-No pain  ? ? ?  ?  ?  ?  ?  ?  ? ?Merlinda Frederick ? ? ? ? ?

## 2022-02-15 NOTE — Op Note (Signed)
DATE OF SERVICE: 02/15/2022 ? ?PATIENT:  Cameron Peterson  56 y.o. male ? ?PRE-OPERATIVE DIAGNOSIS:  ESRD ? ?POST-OPERATIVE DIAGNOSIS:  Same ? ?PROCEDURE:   ?left brachiocephalic arteriovenous fistula creation ? ?SURGEON:  Surgeon(s) and Role: ?   * Cherre Robins, MD - Primary ? ?ASSISTANT: Arlee Muslim, PA-C ? ?ANESTHESIA:   regional and MAC ? ?EBL: minimal ? ?BLOOD ADMINISTERED:none ? ?DRAINS: none  ? ?LOCAL MEDICATIONS USED:  NONE ? ?SPECIMEN:  none ? ?COUNTS: confirmed correct. ? ?TOURNIQUET:  none ? ?PATIENT DISPOSITION:  PACU - hemodynamically stable. ?  ?Delay start of Pharmacological VTE agent (>24hrs) due to surgical blood loss or risk of bleeding: no ? ?INDICATION FOR PROCEDURE: Cameron Peterson is a 56 y.o. male with end-stage renal disease dialyzing via right internal jugular tunneled dialysis catheter. He is in need of permanent dialysis access. Vein mapping showed high quality cephalic vein in the left (non-dominant) arm. After careful discussion of risks, benefits, and alternatives the patient was offered left brachiocephalic arteriovenous fistula. The patient understood and wished to proceed. ? ?OPERATIVE FINDINGS: healthy cephalic and brachial artery. Strong thrill and palpable radial pulse at completion. ? ?DESCRIPTION OF PROCEDURE: After identification of the patient in the pre-operative holding area, the patient was transferred to the operating room. The patient was positioned supine on the operating room table. Anesthesia was induced. The left arm was prepped and draped in standard fashion. A surgical pause was performed confirming correct patient, procedure, and operative location. ? ?Using intraoperative ultrasound, the course of the left upper extremity superficial veins was mapped.  The cephalic vein appeared adequate for arteriovenous fistula creation.  The brachial artery was similarly mapped.  The artery appeared adequate for arterial venous creation. We ensured there was no anomalous  arterial anatomy such as a high bifurcation. ? ?A transverse incision was made in the left arm just distal to the antecubital crease.  Incision was carried down through subcutaneous tissue until the cephalic vein was identified and skeletonized.  We continued our exposure through the aponeurosis of the biceps.  The brachial artery was encountered its usual position.  The artery was circumferentially exposed and encircled with Silastic Vesseloops. ? ?Patient was systemically heparinized.  The distal cephalic vein was transected.  The distal stump of the cephalic vein was oversewn with a 2-0 silk suture.  The proximal vein was controlled with a bulldog clamp.  The brachial artery was clamped proximally medially.  An anterior arteriotomy was made with a 11 blade.  The arteriotomy was extended with Potts scissors.  Using a parachute technique the cephalic vein was anastomosed to the brachial arteriotomy in end-to-side fashion with continuous running suture of 6-0 Prolene.  Immediately prior to completion the anastomosis was flushed and de-aired.  The anastomosis was completed.  Hemostasis was assured. ? ?The fistula was interrogated with Doppler.  Audible bruit was heard throughout the course of the cephalic vein.  A radial artery signal was heard which augmented slightly with compression of the fistula. ? ?Upon completion of the case instrument and sharps counts were confirmed correct. The patient was transferred to the PACU in good condition. I was present for all portions of the procedure. ? ?[name]. Stanford Breed, MD ?[facility] ?Office Phone Number: [phone] ?02/15/2022 8:35 AM ? ? ? ?

## 2022-02-15 NOTE — Discharge Instructions (Signed)
° °  [facility] ° °Discharge Instructions ° °AV Fistula or Graft Surgery for Dialysis Access ° °Please refer to the following instructions for your post-procedure care. Your surgeon or physician assistant will discuss any changes with you. ° °Activity ° °You may drive the day following your surgery, if you are comfortable and no longer taking prescription pain medication. Resume full activity as the soreness in your incision resolves. ° °Bathing/Showering ° °You may shower after you go home. Keep your incision dry for 48 hours. Do not soak in a bathtub, hot tub, or swim until the incision heals completely. You may not shower if you have a hemodialysis catheter. ° °Incision Care ° °Clean your incision with mild soap and water after 48 hours. Pat the area dry with a clean towel. You do not need a bandage unless otherwise instructed. Do not apply any ointments or creams to your incision. You may have skin glue on your incision. Do not peel it off. It will come off on its own in about one week. Your arm may swell a bit after surgery. To reduce swelling use pillows to elevate your arm so it is above your heart. Your doctor will tell you if you need to lightly wrap your arm with an ACE bandage. ° °Diet ° °Resume your normal diet. There are not special food restrictions following this procedure. In order to heal from your surgery, it is CRITICAL to get adequate nutrition. Your body requires vitamins, minerals, and protein. Vegetables are the best source of vitamins and minerals. Vegetables also provide the perfect balance of protein. Processed food has little nutritional value, so try to avoid this. ° °Medications ° °Resume taking all of your medications. If your incision is causing pain, you may take over-the counter pain relievers such as acetaminophen (Tylenol). If you were prescribed a stronger pain medication, please be aware these medications can cause nausea and constipation. Prevent  nausea by taking the medication with a snack or meal. Avoid constipation by drinking plenty of fluids and eating foods with high amount of fiber, such as fruits, vegetables, and grains. Do not take Tylenol if you are taking prescription pain medications. ° ° ° ° °Follow up °Your surgeon may want to see you in the office following your access surgery. If so, this will be arranged at the time of your surgery. ° °Please call us immediately for any of the following conditions: ° °Increased pain, redness, drainage (pus) from your incision site °Fever of 101 degrees or higher °Severe or worsening pain at your incision site °Hand pain or numbness. ° °Reduce your risk of vascular disease: ° °Stop smoking. If you would like help, call [facility] at 1-800-QUIT-NOW (1-800-784-8669) or [facility] at 336-586-4000 ° °Manage your cholesterol °Maintain a desired weight °Control your diabetes °Keep your blood pressure down ° °Dialysis ° °It will take several weeks to several months for your new dialysis access to be ready for use. Your surgeon will determine when it is OK to use it. Your nephrologist will continue to direct your dialysis. You can continue to use your Permcath until your new access is ready for use. ° °If you have any questions, please call the office at [phone]. ° °

## 2022-02-15 NOTE — Progress Notes (Signed)
Orthopedic Tech Progress Note ?Patient Details:  ?Cameron Peterson ?04/15/1966 ?[id number] ? ?PACU RN called requesting an XL ARM SLING for a patient ? ? Ortho Devices ?Type of Ortho Device: Arm sling ?Ortho Device/Splint Location: LUE ?Ortho Device/Splint Interventions: Ordered, Application, Adjustment ?  ?Post Interventions ?Patient Tolerated: Well ?Instructions Provided: Care of device ? ?Janit Pagan ?02/15/2022, 12:00 PM ? ?

## 2022-02-15 NOTE — Anesthesia Procedure Notes (Signed)
Anesthesia Regional Block: Supraclavicular block  ? ?Pre-Anesthetic Checklist: , timeout performed,  Correct Patient, Correct Site, Correct Laterality,  Correct Procedure, Correct Position, site marked,  Risks and benefits discussed,  Surgical consent,  Pre-op evaluation,  At surgeon's request and post-op pain management ? ?Laterality: Left ? ?Prep: chloraprep     ?  ?Needles:  ?Injection technique: Single-shot ? ?Needle Type: Echogenic Stimulator Needle   ? ? ?Needle Length: 10cm  ?Needle Gauge: 20  ? ? ? ?Additional Needles: ? ? ?Procedures:,,,, ultrasound used (permanent image in chart),,    ?Narrative:  ?Start time: 02/15/2022 7:15 AM ?End time: 02/15/2022 7:20 AM ?Injection made incrementally with aspirations every 5 mL. ? ?Performed by: Personally  ?Anesthesiologist: Merlinda Frederick, MD ? ?Additional Notes: ?Functioning IV was confirmed and monitors were applied.  Sterile prep and drape,hand hygiene and sterile gloves were used. Ultrasound guidance: relevant anatomy identified, needle position confirmed, local anesthetic spread visualized around nerve(s)., vascular puncture avoided. Negative aspiration and negative test dose prior to incremental administration of local anesthetic. The patient tolerated the procedure well. ? ? ? ? ? ? ?

## 2022-02-15 NOTE — Anesthesia Procedure Notes (Signed)
Procedure Name: [place] ?Date/Time: 02/15/2022 7:35 AM ?Performed by: Jenne Campus, CRNA ?Pre-anesthesia Checklist: Patient identified, Emergency Drugs available, Patient being monitored and Suction available ? ? ? ? ?

## 2022-02-15 NOTE — Interval H&P Note (Signed)
History and Physical Interval Note: ? ?02/15/2022 ?7:20 AM ? ?Cameron Peterson  has presented today for surgery, with the diagnosis of ESRD.  The various methods of treatment have been discussed with the patient and family. After consideration of risks, benefits and other options for treatment, the patient has consented to  Procedure(s) with comments: ?LEFT BRACHIOCEPHALIC ARTERIOVENOUS (AV) FISTULA CREATION (Left) - PERIPHERAL NERVE BLOCK as a surgical intervention.  The patient's history has been reviewed, patient examined, no change in status, stable for surgery.  I have reviewed the patient's chart and labs.  Questions were answered to the patient's satisfaction.   ? ? ?Cameron Peterson ? ? ?

## 2022-02-15 NOTE — Transfer of Care (Signed)
Immediate Anesthesia Transfer of Care Note ? ?Patient: Cameron Peterson ? ?Procedure(s) Performed: LEFT BRACHIOCEPHALIC ARTERIOVENOUS (AV) FISTULA CREATION (Left: Arm Upper) ? ?Patient Location: PACU ? ?Anesthesia Type:MAC and Spinal ? ?Level of Consciousness: awake, oriented and patient cooperative ? ?Airway & Oxygen Therapy: Patient Spontanous Breathing ? ?Post-op Assessment: Report given to RN and Post -op Vital signs reviewed and stable ? ?Post vital signs: Reviewed ? ?Last Vitals:  ?Vitals Value Taken Time  ?BP    ?Temp    ?Pulse    ?Resp    ?SpO2    ? ? ?Last Pain:  ?Vitals:  ? 02/15/22 0606  ?TempSrc:   ?PainSc: 0-No pain  ?   ? ?  ? ?Complications: No notable events documented. ?

## 2022-02-16 ENCOUNTER — Encounter (HOSPITAL_COMMUNITY): Payer: Self-pay | Admitting: Vascular Surgery

## 2022-02-16 ENCOUNTER — Other Ambulatory Visit: Payer: Self-pay

## 2022-02-18 DIAGNOSIS — E1122 Type 2 diabetes mellitus with diabetic chronic kidney disease: Secondary | ICD-10-CM | POA: Insufficient documentation

## 2022-03-17 ENCOUNTER — Encounter: Payer: Self-pay | Admitting: Internal Medicine

## 2022-03-17 ENCOUNTER — Ambulatory Visit (INDEPENDENT_AMBULATORY_CARE_PROVIDER_SITE_OTHER): Payer: 59 | Admitting: Internal Medicine

## 2022-03-17 VITALS — BP 138/83 | HR 76 | Resp 13 | Ht 76.0 in | Wt 254.2 lb

## 2022-03-17 DIAGNOSIS — M1A3710 Chronic gout due to renal impairment, right ankle and foot, without tophus (tophi): Secondary | ICD-10-CM

## 2022-03-17 NOTE — Progress Notes (Signed)
Office Visit Note  Patient: Cameron Peterson             Date of Birth: Feb 08, 1966           MRN: [id number]             PCP: Benito Mccreedy, MD Referring: Barb Merino, MD Visit Date: 03/17/2022   Subjective:  Pain of the Right Elbow (Given prednisone by the nephrologist and it is .helping  Patient states he has some fluid on his elbow.)   History of Present Illness: Cameron Peterson is a 56 y.o. male here for inflammatory arthritis with history of gout and possible rheumatoid arthritis. He previously saw Dr. Lollie Sails in 2019 and Dr. Graylin Shiver in 2022 for evaluation and management of these problems on treatment with prednisone and allopurinol. Options have been limited primarily related to his advanced renal disease.  He has had mostly episodic pain and swelling at different sites with periods of relatively little symptoms intermixed.  He was put on treatment with allopurinol at currently 150 mg daily most recent uric acid level has been above goal.  No history of erosions or tophaceous deposits.  No history of recurrent kidney stones.  He was never started on any DMARD therapy for rheumatoid arthritis.  Questionable inflammation associated with a highly positive rheumatoid factor checked last year.  Not a good candidate for most DMARD treatment option with CKD stage V.  Currently he has pain and swelling affecting the right elbow he was prescribed a course of prednisone with his nephrologist and symptoms responded well to steroids.  He has frequently had inflammatory symptoms affecting the hands and feet usually worse on the right side these are currently about baseline.  Labs reviewed 06/2021 RF 512 ESR 51 Uric acid 8.3  Activities of Daily Living:  Patient reports morning stiffness for 5 minutes.   Patient Denies nocturnal pain.  Difficulty dressing/grooming: Denies Difficulty climbing stairs: Denies Difficulty getting out of chair: Denies Difficulty using hands for taps,  buttons, cutlery, and/or writing: Denies  Review of Systems  Constitutional:  Negative for fatigue.  HENT:  Negative for mouth dryness.   Eyes:  Negative for dryness.  Respiratory:  Negative for shortness of breath.   Cardiovascular:  Negative for swelling in legs/feet.  Gastrointestinal:  Negative for constipation.  Endocrine: Negative for excessive thirst.  Genitourinary:  Negative for difficulty urinating.  Musculoskeletal:  Positive for joint swelling, muscle weakness, morning stiffness and muscle tenderness.  Skin:  Negative for rash.  Allergic/Immunologic: Negative for susceptible to infections.  Neurological:  Negative for numbness.  Hematological:  Negative for bruising/bleeding tendency.  Psychiatric/Behavioral:  Positive for sleep disturbance.     PMFS History:  Patient Active Problem List   Diagnosis Date Noted   Type 2 diabetes mellitus with diabetic chronic kidney disease ([place]) 02/18/2022   Secondary hyperparathyroidism of renal origin ([place]) 01/26/2022   End stage renal disease ([place]) 01/19/2022   Gout 05/39/7673   Diastolic CHF, chronic ([place]) 01/19/2022   Essential hypertension 01/19/2022   Severe concentric left ventricular hypertrophy 41/93/7902   Acute diastolic CHF (congestive heart failure), NYHA class 1 ([place]) 02/04/2015   Hypertensive urgency 02/02/2015   Acute respiratory failure with hypoxia ([place]) 02/02/2015   Malignant hypertension 02/02/2015    Past Medical History:  Diagnosis Date   Acute diastolic CHF (congestive heart failure), NYHA class 1 ([place]) 02/04/2015   Arthritis    Chronic kidney disease    Dialysis M/T/Th/Fr   COVID 2023  moderate case   GERD (gastroesophageal reflux disease)    Hypertension     Family History  Problem Relation Age of Onset   Diabetes Mellitus II Father    Diabetes Mellitus II Sister    Past Surgical History:  Procedure Laterality Date   A/V FISTULAGRAM Left 04/27/2022   Procedure: A/V Fistulagram;  Surgeon:  Serafina Mitchell, MD;  Location: [facility];  Service: Cardiovascular;  Laterality: Left;   AV FISTULA PLACEMENT Left 02/15/2022   Procedure: LEFT BRACHIOCEPHALIC ARTERIOVENOUS (AV) FISTULA CREATION;  Surgeon: Cherre Robins, MD;  Location: [facility];  Service: Vascular;  Laterality: Left;  PERIPHERAL NERVE BLOCK   FOOT SURGERY Right    FRACTURE SURGERY Right    right wrist   IR FLUORO GUIDE CV LINE RIGHT  01/21/2022   IR US GUIDE VASC ACCESS RIGHT  01/21/2022   PERIPHERAL VASCULAR BALLOON ANGIOPLASTY Left 04/27/2022   Procedure: PERIPHERAL VASCULAR BALLOON ANGIOPLASTY;  Surgeon: Serafina Mitchell, MD;  Location: [facility];  Service: Cardiovascular;  Laterality: Left;  arm fistula   Social History   Social History Narrative   Not on file   Immunization History  Administered Date(s) Administered   Engineer, maintenance ([employer]) SARS-COV-2 Vaccination 01/19/2020     Objective: Vital Signs: BP 138/83 (BP Location: Right Arm, Patient Position: Sitting, Cuff Size: Large)   Pulse 76   Resp 13   Ht '6\' 4"'  (1.93 m)   Wt 254 lb 3.2 oz (115.3 kg)   BMI 30.94 kg/m    Physical Exam Cardiovascular:     Rate and Rhythm: Normal rate and regular rhythm.     Comments: Upper arm AV fistula Pulmonary:     Effort: Pulmonary effort is normal.     Breath sounds: Normal breath sounds.  Skin:    General: Skin is warm and dry.  Neurological:     Mental Status: He is alert.      Musculoskeletal Exam:  Neck full ROM no tenderness Shoulders full ROM no tenderness or swelling Elbows full ROM, right elbow swelling over olecranon bursa mild tenderness to pressure Wrists full ROM no tenderness or swelling Right hand fingers with some chronic proximal joint enlargement and decreased flexion range of motion Knees full ROM no tenderness or swelling   Investigation: No additional findings.  Imaging: No results found.  Recent Labs: Lab Results  Component Value Date   WBC 7.1 01/21/2022   HGB 10.5  (L) 04/27/2022   PLT 278 01/21/2022   NA 142 04/27/2022   K 4.0 04/27/2022   CL 103 04/27/2022   CO2 25 01/25/2022   GLUCOSE 88 04/27/2022   BUN 36 (H) 04/27/2022   CREATININE 10.80 (H) 04/27/2022   BILITOT 0.5 01/19/2022   ALKPHOS 51 01/19/2022   AST 12 (L) 01/19/2022   ALT 14 01/19/2022   PROT 5.6 (L) 01/19/2022   ALBUMIN <1.5 (L) 01/25/2022   CALCIUM 7.4 (L) 01/25/2022   GFRAA 24 (L) 02/04/2015    Speciality Comments: No specialty comments available.  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  Checking uric acid level today if this remains above goal we will need  to continue titration at a slow rate due to severe renal insufficiency.  If at goal can can continue the current 150 mg p.o. daily.  Not a good candidate for chronic colchicine prophylaxis or NSAIDs.  Orders: Orders Placed This Encounter  Procedures   Uric acid   Meds ordered this encounter  Medications   allopurinol (ZYLOPRIM) 100 MG tablet    Sig: Take 1.5 tablets (150 mg total) by mouth daily.    Dispense:  135 tablet    Refill:  0    Follow-Up Instructions: Return in about 3 months (around 06/17/2022) for New pt gout/?RA on allopurinol/prednisone f/u 49mo.   CCollier Salina MD  Note - This record has been created using [employer]  Chart creation errors have been sought, but may not always  have been located. Such creation errors do not reflect on  the standard of medical care.

## 2022-03-18 LAB — URIC ACID: Uric Acid, Serum: 3.9 mg/dL — ABNORMAL LOW (ref 4.0–8.0)

## 2022-03-18 MED ORDER — ALLOPURINOL 100 MG PO TABS: 150.0000 mg | ORAL_TABLET | Freq: Every day | ORAL | 0 refills | Status: AC

## 2022-03-18 NOTE — Progress Notes (Signed)
Uric acid is 3.9 which is definitely low enough. His previously prescribed allopurinol dose was 1.5 tablets daily. Is he taking this as directed? If he is taking it differently, I would recommend continuing how he takes this currently without changes. We do not need to keep scheduled follow up on 6/21. We can follow up in 3 months or PRN if he has a bad flare up.

## 2022-03-18 NOTE — Progress Notes (Signed)
His uric acid is well below goal. If this is currently off the allopurinol, then he may be okay to remain off the medicine completely for now. Watching his diet and regular hemodialysis can also help with control of uric acid level.

## 2022-03-30 ENCOUNTER — Other Ambulatory Visit: Payer: Self-pay | Admitting: *Deleted

## 2022-03-30 DIAGNOSIS — N186 End stage renal disease: Secondary | ICD-10-CM

## 2022-03-31 ENCOUNTER — Ambulatory Visit: Payer: 59 | Admitting: Internal Medicine

## 2022-04-02 ENCOUNTER — Telehealth: Payer: Self-pay | Admitting: Internal Medicine

## 2022-04-02 MED ORDER — PREDNISONE 10 MG PO TABS: ORAL_TABLET | ORAL | 0 refills | Status: AC

## 2022-04-02 NOTE — Telephone Encounter (Signed)
See previous phone message. 

## 2022-04-02 NOTE — Telephone Encounter (Signed)
Patient called the office stating he is having an awful gout flare and would like prednisone sent to [facility] on [address].

## 2022-04-05 NOTE — H&P (View-Only) (Signed)
[facility] [place]  ASSESSMENT / PLAN: Cameron Peterson is a 56 y.o. s/p L brachiocephalic arteriovenous fistula creation 02/15/22.  The fistula has not yet matured and has multiple sidebranches and some possible stenosis versus chronic thrombus.  I offer the patient fistulogram.  We will arrange this on a nondialysis day in the near future.  CHIEF COMPLAINT: ESRD  HISTORY OF PRESENT ILLNESS: Cameron Peterson is a 56 y.o. male with ESRD in need of permanent dialysis access.  He has been dialyzing through a right IJ tunneled dialysis catheter for the past several weeks.  He is tolerated dialysis fairly well.  He is right-handed.  He has never had dialysis access before.  We spent the bulk of our visit discussing the options for dialysis access.  We briefly discussed access related hand ischemia.  He wishes to proceed with hemodialysis access surgery.  04/06/22: Patient returns for follow-up after fistula creation.  The fistula is not bothering him from a steal standpoint.  He has normal motor and sensory function of the left arm.  Past Medical History:  Diagnosis Date   Acute diastolic CHF (congestive heart failure), NYHA class 1 ([place]) 02/04/2015   Arthritis    Chronic kidney disease    Dialysis M/T/Th/Fr   COVID 2023   moderate case   GERD (gastroesophageal reflux disease)    Hypertension     Past Surgical History:  Procedure Laterality Date   AV FISTULA PLACEMENT Left 02/15/2022   Procedure: LEFT BRACHIOCEPHALIC ARTERIOVENOUS (AV) FISTULA CREATION;  Surgeon: Cherre Robins, MD;  Location: [facility];  Service: Vascular;  Laterality: Left;  PERIPHERAL NERVE BLOCK   FOOT SURGERY Right    FRACTURE SURGERY Right    right wrist   IR FLUORO GUIDE CV LINE RIGHT  01/21/2022   IR US GUIDE VASC ACCESS RIGHT  01/21/2022    Family History  Problem Relation Age of Onset   Diabetes Mellitus II Father    Diabetes Mellitus II Sister     Social History   Socioeconomic  History   Marital status: Married    Spouse name: Not on file   Number of children: Not on file   Years of education: Not on file   Highest education level: Not on file  Occupational History   Not on file  Tobacco Use   Smoking status: Never    Passive exposure: Never   Smokeless tobacco: Never  Vaping Use   Vaping Use: Never used  Substance and Sexual Activity   Alcohol use: Not Currently    Comment: 1 pint of liquor a week   Drug use: No   Sexual activity: Yes  Other Topics Concern   Not on file  Social History Narrative   Not on file   Social Determinants of Health   Financial Resource Strain: Not on file  Food Insecurity: Not on file  Transportation Needs: Not on file  Physical Activity: Not on file  Stress: Not on file  Social Connections: Not on file  Intimate Partner Violence: Not on file    Allergies  Allergen Reactions   Lisinopril Swelling   Shellfish Allergy Swelling   Other Other (See Comments)    Current Outpatient Medications  Medication Sig Dispense Refill   allopurinol (ZYLOPRIM) 100 MG tablet Take 1.5 tablets (150 mg total) by mouth daily. 135 tablet 0   atorvastatin (LIPITOR) 20 MG tablet Take 20 mg by mouth daily.     B Complex-C-Folic Acid (DIALYVITE  TABLET) TABS Take 1 tablet by mouth daily.     carvedilol (COREG) 3.125 MG tablet Take 1 tablet (3.125 mg total) by mouth 2 (two) times daily with a meal. 60 tablet 1   HYDROcodone-acetaminophen (NORCO) 5-325 MG tablet Take 1 tablet by mouth every 6 (six) hours as needed for moderate pain. 10 tablet 0   predniSONE (DELTASONE) 10 MG tablet Take 4 tablets (40 mg total) by mouth daily with breakfast for 3 days, THEN 3 tablets (30 mg total) daily with breakfast for 3 days, THEN 2 tablets (20 mg total) daily with breakfast for 3 days, THEN 1 tablet (10 mg total) daily with breakfast for 3 days. 30 tablet 0   sevelamer carbonate (RENVELA) 800 MG tablet Take 1,600 mg by mouth 3 (three) times daily.      Tetrahydrozoline HCl (VISINE OP) Place 1 drop into both eyes daily as needed (dry eyes).     losartan (COZAAR) 25 MG tablet Take 2 tablets (50 mg total) by mouth daily. (Patient taking differently: Take 25 mg by mouth in the morning and at bedtime.) 60 tablet 0   No current facility-administered medications for this visit.    PHYSICAL EXAM Vitals:   04/06/22 1603  BP: 134/87  Pulse: (!) 20  Resp: 20  Temp: 98.3 F (36.8 C)  TempSrc: Temporal  SpO2: 97%  Weight: 255 lb 3.2 oz (115.8 kg)  Height: 6\' 4"  (1.93 m)     Constitutional: Well-appearing man in no acute distress Cardiac: regular rate and rhythm.  Respiratory:  unlabored. Abdominal:  soft, non-tender, non-distended.  Peripheral vascular: Well-healed brachiocephalic incision.  Weak thrill in the fistula.  2+ radial pulses bilaterally.  PERTINENT LABORATORY AND RADIOLOGIC DATA  Most recent CBC    Latest Ref Rng & Units 02/15/2022    6:22 AM 01/21/2022    7:10 AM 01/20/2022    4:12 AM  CBC  WBC 4.0 - 10.5 K/uL  7.1  5.8   Hemoglobin 13.0 - 17.0 g/dL 9.9  8.3  7.6   Hematocrit 39.0 - 52.0 % 29.0  24.7  22.8   Platelets 150 - 400 K/uL  278  260      Most recent CMP    Latest Ref Rng & Units 02/15/2022    6:22 AM 01/25/2022    2:30 AM 01/24/2022    4:14 AM  CMP  Glucose 70 - 99 mg/dL 89  114  87   BUN 6 - 20 mg/dL 37  37  30   Creatinine 0.61 - 1.24 mg/dL 10.90  8.44  6.89   Sodium 135 - 145 mmol/L 144  141  141   Potassium 3.5 - 5.1 mmol/L 3.5  3.5  3.2   Chloride 98 - 111 mmol/L 110  109  107   CO2 22 - 32 mmol/L  25  28   Calcium 8.9 - 10.3 mg/dL  7.4  7.3    Postoperative fistula duplex Low flow through the fistula (less than 600 mL a minute) Branching and narrowing in the fistula noted  [name]. Stanford Breed, MD [facility] Phone Number: [phone] 04/06/2022 4:58 PM  Total time spent on preparing this encounter including chart review, data review, collecting history,  examining the patient, coordinating care for this established patient, 20 minutes.  Portions of this report may have been transcribed using voice recognition software.  Every effort has been made to ensure accuracy; however, inadvertent computerized transcription errors may still be  present.

## 2022-04-05 NOTE — Progress Notes (Signed)
[facility] [place]  ASSESSMENT / PLAN: Cameron Peterson is a 56 y.o. s/p L brachiocephalic arteriovenous fistula creation 02/15/22.  The fistula has not yet matured and has multiple sidebranches and some possible stenosis versus chronic thrombus.  I offer the patient fistulogram.  We will arrange this on a nondialysis day in the near future.  CHIEF COMPLAINT: ESRD  HISTORY OF PRESENT ILLNESS: Cameron Peterson is a 56 y.o. male with ESRD in need of permanent dialysis access.  He has been dialyzing through a right IJ tunneled dialysis catheter for the past several weeks.  He is tolerated dialysis fairly well.  He is right-handed.  He has never had dialysis access before.  We spent the bulk of our visit discussing the options for dialysis access.  We briefly discussed access related hand ischemia.  He wishes to proceed with hemodialysis access surgery.  04/06/22: Patient returns for follow-up after fistula creation.  The fistula is not bothering him from a steal standpoint.  He has normal motor and sensory function of the left arm.  Past Medical History:  Diagnosis Date   Acute diastolic CHF (congestive heart failure), NYHA class 1 ([place]) 02/04/2015   Arthritis    Chronic kidney disease    Dialysis M/T/Th/Fr   COVID 2023   moderate case   GERD (gastroesophageal reflux disease)    Hypertension     Past Surgical History:  Procedure Laterality Date   AV FISTULA PLACEMENT Left 02/15/2022   Procedure: LEFT BRACHIOCEPHALIC ARTERIOVENOUS (AV) FISTULA CREATION;  Surgeon: Cherre Robins, MD;  Location: [facility];  Service: Vascular;  Laterality: Left;  PERIPHERAL NERVE BLOCK   FOOT SURGERY Right    FRACTURE SURGERY Right    right wrist   IR FLUORO GUIDE CV LINE RIGHT  01/21/2022   IR US GUIDE VASC ACCESS RIGHT  01/21/2022    Family History  Problem Relation Age of Onset   Diabetes Mellitus II Father    Diabetes Mellitus II Sister     Social History   Socioeconomic  History   Marital status: Married    Spouse name: Not on file   Number of children: Not on file   Years of education: Not on file   Highest education level: Not on file  Occupational History   Not on file  Tobacco Use   Smoking status: Never    Passive exposure: Never   Smokeless tobacco: Never  Vaping Use   Vaping Use: Never used  Substance and Sexual Activity   Alcohol use: Not Currently    Comment: 1 pint of liquor a week   Drug use: No   Sexual activity: Yes  Other Topics Concern   Not on file  Social History Narrative   Not on file   Social Determinants of Health   Financial Resource Strain: Not on file  Food Insecurity: Not on file  Transportation Needs: Not on file  Physical Activity: Not on file  Stress: Not on file  Social Connections: Not on file  Intimate Partner Violence: Not on file    Allergies  Allergen Reactions   Lisinopril Swelling   Shellfish Allergy Swelling   Other Other (See Comments)    Current Outpatient Medications  Medication Sig Dispense Refill   allopurinol (ZYLOPRIM) 100 MG tablet Take 1.5 tablets (150 mg total) by mouth daily. 135 tablet 0   atorvastatin (LIPITOR) 20 MG tablet Take 20 mg by mouth daily.     B Complex-C-Folic Acid (DIALYVITE  TABLET) TABS Take 1 tablet by mouth daily.     carvedilol (COREG) 3.125 MG tablet Take 1 tablet (3.125 mg total) by mouth 2 (two) times daily with a meal. 60 tablet 1   HYDROcodone-acetaminophen (NORCO) 5-325 MG tablet Take 1 tablet by mouth every 6 (six) hours as needed for moderate pain. 10 tablet 0   predniSONE (DELTASONE) 10 MG tablet Take 4 tablets (40 mg total) by mouth daily with breakfast for 3 days, THEN 3 tablets (30 mg total) daily with breakfast for 3 days, THEN 2 tablets (20 mg total) daily with breakfast for 3 days, THEN 1 tablet (10 mg total) daily with breakfast for 3 days. 30 tablet 0   sevelamer carbonate (RENVELA) 800 MG tablet Take 1,600 mg by mouth 3 (three) times daily.      Tetrahydrozoline HCl (VISINE OP) Place 1 drop into both eyes daily as needed (dry eyes).     losartan (COZAAR) 25 MG tablet Take 2 tablets (50 mg total) by mouth daily. (Patient taking differently: Take 25 mg by mouth in the morning and at bedtime.) 60 tablet 0   No current facility-administered medications for this visit.    PHYSICAL EXAM Vitals:   04/06/22 1603  BP: 134/87  Pulse: (!) 20  Resp: 20  Temp: 98.3 F (36.8 C)  TempSrc: Temporal  SpO2: 97%  Weight: 255 lb 3.2 oz (115.8 kg)  Height: 6\' 4"  (1.93 m)     Constitutional: Well-appearing man in no acute distress Cardiac: regular rate and rhythm.  Respiratory:  unlabored. Abdominal:  soft, non-tender, non-distended.  Peripheral vascular: Well-healed brachiocephalic incision.  Weak thrill in the fistula.  2+ radial pulses bilaterally.  PERTINENT LABORATORY AND RADIOLOGIC DATA  Most recent CBC    Latest Ref Rng & Units 02/15/2022    6:22 AM 01/21/2022    7:10 AM 01/20/2022    4:12 AM  CBC  WBC 4.0 - 10.5 K/uL  7.1  5.8   Hemoglobin 13.0 - 17.0 g/dL 9.9  8.3  7.6   Hematocrit 39.0 - 52.0 % 29.0  24.7  22.8   Platelets 150 - 400 K/uL  278  260      Most recent CMP    Latest Ref Rng & Units 02/15/2022    6:22 AM 01/25/2022    2:30 AM 01/24/2022    4:14 AM  CMP  Glucose 70 - 99 mg/dL 89  114  87   BUN 6 - 20 mg/dL 37  37  30   Creatinine 0.61 - 1.24 mg/dL 10.90  8.44  6.89   Sodium 135 - 145 mmol/L 144  141  141   Potassium 3.5 - 5.1 mmol/L 3.5  3.5  3.2   Chloride 98 - 111 mmol/L 110  109  107   CO2 22 - 32 mmol/L  25  28   Calcium 8.9 - 10.3 mg/dL  7.4  7.3    Postoperative fistula duplex Low flow through the fistula (less than 600 mL a minute) Branching and narrowing in the fistula noted  [name]. Stanford Breed, MD [facility] Phone Number: [phone] 04/06/2022 4:58 PM  Total time spent on preparing this encounter including chart review, data review, collecting history,  examining the patient, coordinating care for this established patient, 20 minutes.  Portions of this report may have been transcribed using voice recognition software.  Every effort has been made to ensure accuracy; however, inadvertent computerized transcription errors may still be  present.

## 2022-04-06 ENCOUNTER — Ambulatory Visit (INDEPENDENT_AMBULATORY_CARE_PROVIDER_SITE_OTHER): Payer: 59 | Admitting: Vascular Surgery

## 2022-04-06 ENCOUNTER — Encounter: Payer: Self-pay | Admitting: Vascular Surgery

## 2022-04-06 ENCOUNTER — Ambulatory Visit (HOSPITAL_COMMUNITY)
Admission: RE | Admit: 2022-04-06 | Discharge: 2022-04-06 | Disposition: A | Discharge status: Home / Self Care | Payer: 59 | Source: Ambulatory Visit | Attending: Vascular Surgery | Admitting: Vascular Surgery

## 2022-04-06 VITALS — BP 134/87 | HR 20 | Temp 98.3°F | Resp 20 | Ht 76.0 in | Wt 255.2 lb

## 2022-04-06 DIAGNOSIS — Z992 Dependence on renal dialysis: Secondary | ICD-10-CM | POA: Diagnosis not present

## 2022-04-06 DIAGNOSIS — N186 End stage renal disease: Secondary | ICD-10-CM

## 2022-04-08 ENCOUNTER — Other Ambulatory Visit: Payer: Self-pay

## 2022-04-08 DIAGNOSIS — N186 End stage renal disease: Secondary | ICD-10-CM

## 2022-04-27 ENCOUNTER — Ambulatory Visit (HOSPITAL_COMMUNITY)
Admission: RE | Admit: 2022-04-27 | Discharge: 2022-04-27 | Disposition: A | Discharge status: Home / Self Care | Payer: 59 | Attending: Surgery | Admitting: Surgery

## 2022-04-27 ENCOUNTER — Other Ambulatory Visit: Payer: Self-pay

## 2022-04-27 ENCOUNTER — Encounter (HOSPITAL_COMMUNITY)
Admission: RE | Disposition: A | Discharge status: Home / Self Care | Payer: Self-pay | Source: Home / Self Care | Attending: Surgery

## 2022-04-27 ENCOUNTER — Encounter (HOSPITAL_COMMUNITY): Payer: Self-pay | Admitting: Surgery

## 2022-04-27 DIAGNOSIS — T82858A Stenosis of vascular prosthetic devices, implants and grafts, initial encounter: Secondary | ICD-10-CM | POA: Diagnosis present

## 2022-04-27 DIAGNOSIS — I132 Hypertensive heart and chronic kidney disease with heart failure and with stage 5 chronic kidney disease, or end stage renal disease: Secondary | ICD-10-CM | POA: Diagnosis not present

## 2022-04-27 DIAGNOSIS — N185 Chronic kidney disease, stage 5: Secondary | ICD-10-CM | POA: Diagnosis not present

## 2022-04-27 DIAGNOSIS — I5032 Chronic diastolic (congestive) heart failure: Secondary | ICD-10-CM | POA: Insufficient documentation

## 2022-04-27 DIAGNOSIS — N186 End stage renal disease: Secondary | ICD-10-CM | POA: Diagnosis not present

## 2022-04-27 DIAGNOSIS — Z992 Dependence on renal dialysis: Secondary | ICD-10-CM | POA: Insufficient documentation

## 2022-04-27 DIAGNOSIS — I10 Essential (primary) hypertension: Secondary | ICD-10-CM

## 2022-04-27 DIAGNOSIS — Y832 Surgical operation with anastomosis, bypass or graft as the cause of abnormal reaction of the patient, or of later complication, without mention of misadventure at the time of the procedure: Secondary | ICD-10-CM | POA: Diagnosis not present

## 2022-04-27 DIAGNOSIS — T82898A Other specified complication of vascular prosthetic devices, implants and grafts, initial encounter: Secondary | ICD-10-CM | POA: Diagnosis not present

## 2022-04-27 HISTORY — PX: PERIPHERAL VASCULAR BALLOON ANGIOPLASTY: CATH118281

## 2022-04-27 HISTORY — PX: A/V FISTULAGRAM: CATH118298

## 2022-04-27 LAB — POCT I-STAT, CHEM 8
BUN: 36 mg/dL — ABNORMAL HIGH (ref 6–20)
Calcium, Ion: 1.06 mmol/L — ABNORMAL LOW (ref 1.15–1.40)
Chloride: 103 mmol/L (ref 98–111)
Creatinine, Ser: 10.8 mg/dL — ABNORMAL HIGH (ref 0.61–1.24)
Glucose, Bld: 88 mg/dL (ref 70–99)
HCT: 31 % — ABNORMAL LOW (ref 39.0–52.0)
Hemoglobin: 10.5 g/dL — ABNORMAL LOW (ref 13.0–17.0)
Potassium: 4 mmol/L (ref 3.5–5.1)
Sodium: 142 mmol/L (ref 135–145)
TCO2: 29 mmol/L (ref 22–32)

## 2022-04-27 SURGERY — A/V FISTULAGRAM
Anesthesia: LOCAL | Laterality: Left

## 2022-04-27 MED ORDER — LIDOCAINE HCL (PF) 1 % IJ SOLN
INTRAMUSCULAR | Status: DC | PRN
  Administered 2022-04-27: 2 mL

## 2022-04-27 MED ORDER — LIDOCAINE HCL (PF) 1 % IJ SOLN
INTRAMUSCULAR | Status: AC
  Filled 2022-04-27: qty 30

## 2022-04-27 MED ORDER — FENTANYL CITRATE (PF) 100 MCG/2ML IJ SOLN
INTRAMUSCULAR | Status: AC
  Filled 2022-04-27: qty 2

## 2022-04-27 MED ORDER — HEPARIN (PORCINE) IN NACL 1000-0.9 UT/500ML-% IV SOLN: INTRAVENOUS | Status: DC | PRN

## 2022-04-27 MED ORDER — HEPARIN (PORCINE) IN NACL 2000-0.9 UNIT/L-% IV SOLN
INTRAVENOUS | Status: AC
  Filled 2022-04-27: qty 1000

## 2022-04-27 MED ORDER — HEPARIN (PORCINE) IN NACL 2000-0.9 UNIT/L-% IV SOLN
INTRAVENOUS | Status: DC | PRN
  Administered 2022-04-27: 1000 mL

## 2022-04-27 MED ORDER — FENTANYL CITRATE (PF) 100 MCG/2ML IJ SOLN
INTRAMUSCULAR | Status: DC | PRN
  Administered 2022-04-27: 25 ug via INTRAVENOUS

## 2022-04-27 MED ORDER — MIDAZOLAM HCL 2 MG/2ML IJ SOLN
INTRAMUSCULAR | Status: AC
  Filled 2022-04-27: qty 2

## 2022-04-27 MED ORDER — IODIXANOL 320 MG/ML IV SOLN
INTRAVENOUS | Status: DC | PRN
  Administered 2022-04-27: 70 mL via INTRAVENOUS

## 2022-04-27 MED ORDER — MIDAZOLAM HCL 2 MG/2ML IJ SOLN
INTRAMUSCULAR | Status: DC | PRN
  Administered 2022-04-27: 1 mg via INTRAVENOUS

## 2022-04-27 SURGICAL SUPPLY — 14 items
BAG SNAP BAND KOVER 36X36 (MISCELLANEOUS) ×2 IMPLANT
BALLN MUSTANG 7X80X75 (BALLOONS) ×2
BALLOON MUSTANG 7X80X75 (BALLOONS) IMPLANT
COVER DOME SNAP 22 D (MISCELLANEOUS) ×2 IMPLANT
KIT ENCORE 26 ADVANTAGE (KITS) ×1 IMPLANT
KIT MICROPUNCTURE NIT STIFF (SHEATH) ×1 IMPLANT
PROTECTION STATION PRESSURIZED (MISCELLANEOUS) ×2
SHEATH PINNACLE R/O II 6F 4CM (SHEATH) ×1 IMPLANT
SHEATH PROBE COVER 6X72 (BAG) ×3 IMPLANT
STATION PROTECTION PRESSURIZED (MISCELLANEOUS) ×1 IMPLANT
STOPCOCK MORSE 400PSI 3WAY (MISCELLANEOUS) ×2 IMPLANT
TRAY PV CATH (CUSTOM PROCEDURE TRAY) ×2 IMPLANT
TUBING CIL FLEX 10 FLL-RA (TUBING) ×2 IMPLANT
WIRE STARTER BENTSON 035X150 (WIRE) ×1 IMPLANT

## 2022-04-27 NOTE — Interval H&P Note (Signed)
History and Physical Interval Note:  04/27/2022 8:47 AM  Cameron Peterson  has presented today for surgery, with the diagnosis of instage renal.  The various methods of treatment have been discussed with the patient and family. After consideration of risks, benefits and other options for treatment, the patient has consented to  Procedure(s): A/V Fistulagram (Left) as a surgical intervention.  The patient's history has been reviewed, patient examined, no change in status, stable for surgery.  I have reviewed the patient's chart and labs.  Questions were answered to the patient's satisfaction.     [name]

## 2022-04-27 NOTE — Interval H&P Note (Signed)
History and Physical Interval Note:  04/27/2022 8:57 AM  Cameron Peterson  has presented today for surgery, with the diagnosis of instage renal.  The various methods of treatment have been discussed with the patient and family. After consideration of risks, benefits and other options for treatment, the patient has consented to  Procedure(s): A/V Fistulagram (Left) as a surgical intervention.  The patient's history has been reviewed, patient examined, no change in status, stable for surgery.  I have reviewed the patient's chart and labs.  Questions were answered to the patient's satisfaction.     [name]

## 2022-04-27 NOTE — Progress Notes (Signed)
Patient was given discharge instructions. He verbalized understanding. 

## 2022-04-27 NOTE — Op Note (Signed)
    Patient name: Cameron Peterson MRN: [id number] DOB: Nov 30, 1965 Sex: male  04/27/2022 Pre-operative Diagnosis: Non-maturing left brachiocephalic fistula Post-operative diagnosis:  Same Surgeon:  [name] Procedure Performed:  1.  Ultrasound-guided access, left cephalic vein  2.  Fistulogram  3.  Angioplasty, left cephalic vein (peripheral vein)  4.  Conscious sedation, 29 minutes   Indications: This is a 56 year old gentleman with a left brachiocephalic fistula that is not matured.  Is here for further ration  Procedure:  The patient was identified in the holding area and taken to room 8.  The patient was then placed supine on the table and prepped and draped in the usual sterile fashion.  A time out was called.  Conscious sedation was administered with the use of IV fentanyl and Versed under continuous physician and nurse monitoring.  Heart rate, blood pressure, and oxygen saturations were continuously monitored.  Total sedation time was 29 minutes ultrasound was used to evaluate the fistula.  The vein was patent and compressible.  A digital ultrasound image was acquired.  The fistula was then accessed under ultrasound guidance using a micropuncture needle.  An 018 wire was then asvanced without resistance and a micropuncture sheath was placed.  Contrast injections were then performed through the sheath.  Findings: Several very small side branches were identified.  The arterial venous anastomosis is widely patent.  The vein is of normal caliber with consistent diameter throughout the arm measuring 4 to 5 mm.  There did appear to be a stenosis at the cephalic vein subclavian vein junction.   Intervention: After the above images were acquired decision made to proceed with intervention.  A 6 French sheath was placed over a Bentson wire.  I selected a 7 x 100 balloon and performed balloon venoplasty of the cephalic vein junction.  I then proceeded with venoplasty of the entire cephalic vein  as there did appear to be contrast hanging up within the vein after intervention.  Final imaging was performed.  The vein appeared to be spasm somewhat.  The stenosis at the cephalic vein junction has now resolved.  The patient was getting uncomfortable and irritated because of pain despite sedation, I felt the best thing to do was to stop as we had treated the underlying lesion.  I felt the spasm from the intervention of the majority of the fistula should resolve with time.  The sheath was removed with a Monocryl suture.  Impression:  #1  Successful venoplasty of the cephalic vein junction for approximately a 60 to 70% stenosis using a 7 mm balloon  #2  I performed venoplasty of the majority of the fistula using a 7 mm balloon.  Completion imaging showed that the vein was in spasm.  The patient was getting irritated from discomfort from balloon venoplasty and contrast injections.  Therefore I elected to leave this as the underlying lesion had been addressed.  #3  The patient will follow-up in the office for evaluation in 3 to 4 weeks    V. [name], M.D., [facility] [place] Office: [phone] Pager:  [phone]

## 2022-05-05 ENCOUNTER — Other Ambulatory Visit: Payer: Self-pay | Admitting: *Deleted

## 2022-05-05 DIAGNOSIS — N186 End stage renal disease: Secondary | ICD-10-CM

## 2022-05-31 NOTE — Progress Notes (Unsigned)
[facility] [place]  ASSESSMENT / PLAN: Cameron Peterson is a 56 y.o. s/p L brachiocephalic arteriovenous fistula creation 02/15/22.  The fistula has not yet matured and has multiple sidebranches and some possible stenosis versus chronic thrombus.  I offer the patient fistulogram.  We will arrange this on a nondialysis day in the near future.  CHIEF COMPLAINT: ESRD  HISTORY OF PRESENT ILLNESS: Cameron Peterson is a 56 y.o. male with ESRD in need of permanent dialysis access.  He has been dialyzing through a right IJ tunneled dialysis catheter for the past several weeks.  He is tolerated dialysis fairly well.  He is right-handed.  He has never had dialysis access before.  We spent the bulk of our visit discussing the options for dialysis access.  We briefly discussed access related hand ischemia.  He wishes to proceed with hemodialysis access surgery.  04/06/22: Patient returns for follow-up after fistula creation.  The fistula is not bothering him from a steal standpoint.  He has normal motor and sensory function of the left arm.  Past Medical History:  Diagnosis Date   Acute diastolic CHF (congestive heart failure), NYHA class 1 ([place]) 02/04/2015   Arthritis    Chronic kidney disease    Dialysis M/T/Th/Fr   COVID 2023   moderate case   GERD (gastroesophageal reflux disease)    Hypertension     Past Surgical History:  Procedure Laterality Date   A/V FISTULAGRAM Left 04/27/2022   Procedure: A/V Fistulagram;  Surgeon: Serafina Mitchell, MD;  Location: [place] [facility];  Service: Cardiovascular;  Laterality: Left;   AV FISTULA PLACEMENT Left 02/15/2022   Procedure: LEFT BRACHIOCEPHALIC ARTERIOVENOUS (AV) FISTULA CREATION;  Surgeon: Cherre Robins, MD;  Location: [facility];  Service: Vascular;  Laterality: Left;  PERIPHERAL NERVE BLOCK   FOOT SURGERY Right    FRACTURE SURGERY Right    right wrist   IR FLUORO GUIDE CV LINE RIGHT  01/21/2022   IR US GUIDE VASC ACCESS  RIGHT  01/21/2022   PERIPHERAL VASCULAR BALLOON ANGIOPLASTY Left 04/27/2022   Procedure: PERIPHERAL VASCULAR BALLOON ANGIOPLASTY;  Surgeon: Serafina Mitchell, MD;  Location: [place] [facility];  Service: Cardiovascular;  Laterality: Left;  arm fistula    Family History  Problem Relation Age of Onset   Diabetes Mellitus II Father    Diabetes Mellitus II Sister     Social History   Socioeconomic History   Marital status: Married    Spouse name: Not on file   Number of children: Not on file   Years of education: Not on file   Highest education level: Not on file  Occupational History   Not on file  Tobacco Use   Smoking status: Never    Passive exposure: Never   Smokeless tobacco: Never  Vaping Use   Vaping Use: Never used  Substance and Sexual Activity   Alcohol use: Not Currently    Comment: 1 pint of liquor a week   Drug use: No   Sexual activity: Yes  Other Topics Concern   Not on file  Social History Narrative   Not on file   Social Determinants of Health   Financial Resource Strain: Not on file  Food Insecurity: Not on file  Transportation Needs: Not on file  Physical Activity: Not on file  Stress: Not on file  Social Connections: Not on file  Intimate Partner Violence: Not on file    Allergies  Allergen Reactions  Lisinopril Swelling   Shellfish Allergy Swelling   Other Swelling    Peanuts     Current Outpatient Medications  Medication Sig Dispense Refill   allopurinol (ZYLOPRIM) 100 MG tablet Take 1.5 tablets (150 mg total) by mouth daily. 135 tablet 0   atorvastatin (LIPITOR) 20 MG tablet Take 20 mg by mouth daily.     AURYXIA 1 GM 210 MG(Fe) tablet Take 210 mg by mouth 3 (three) times daily with meals.     B Complex-C-Folic Acid (DIALYVITE TABLET) TABS Take 1 tablet by mouth daily.     carvedilol (COREG) 3.125 MG tablet Take 1 tablet (3.125 mg total) by mouth 2 (two) times daily with a meal. 60 tablet 1   losartan (COZAAR) 25 MG tablet Take 25 mg  by mouth in the morning and at bedtime.     Tetrahydrozoline HCl (VISINE OP) Place 1 drop into both eyes daily as needed (dry eyes).     No current facility-administered medications for this visit.    PHYSICAL EXAM There were no vitals filed for this visit.    Constitutional: Well-appearing man in no acute distress Cardiac: regular rate and rhythm.  Respiratory:  unlabored. Abdominal:  soft, non-tender, non-distended.  Peripheral vascular: Well-healed brachiocephalic incision.  Weak thrill in the fistula.  2+ radial pulses bilaterally.  PERTINENT LABORATORY AND RADIOLOGIC DATA  Most recent CBC    Latest Ref Rng & Units 04/27/2022    8:53 AM 02/15/2022    6:22 AM 01/21/2022    7:10 AM  CBC  WBC 4.0 - 10.5 K/uL   7.1   Hemoglobin 13.0 - 17.0 g/dL 10.5  9.9  8.3   Hematocrit 39.0 - 52.0 % 31.0  29.0  24.7   Platelets 150 - 400 K/uL   278      Most recent CMP    Latest Ref Rng & Units 04/27/2022    8:53 AM 02/15/2022    6:22 AM 01/25/2022    2:30 AM  CMP  Glucose 70 - 99 mg/dL 88  89  114   BUN 6 - 20 mg/dL 36  37  37   Creatinine 0.61 - 1.24 mg/dL 10.80  10.90  8.44   Sodium 135 - 145 mmol/L 142  144  141   Potassium 3.5 - 5.1 mmol/L 4.0  3.5  3.5   Chloride 98 - 111 mmol/L 103  110  109   CO2 22 - 32 mmol/L   25   Calcium 8.9 - 10.3 mg/dL   7.4    Postoperative fistula duplex Low flow through the fistula (less than 600 mL a minute) Branching and narrowing in the fistula noted  [name]. Stanford Breed, MD [facility] Phone Number: [phone] 05/31/2022 5:38 PM  Total time spent on preparing this encounter including chart review, data review, collecting history, examining the patient, coordinating care for this established patient, 20 minutes.  Portions of this report may have been transcribed using voice recognition software.  Every effort has been made to ensure accuracy; however, inadvertent computerized transcription errors may  still be present.

## 2022-06-01 ENCOUNTER — Ambulatory Visit (INDEPENDENT_AMBULATORY_CARE_PROVIDER_SITE_OTHER): Payer: 59 | Admitting: Vascular Surgery

## 2022-06-01 ENCOUNTER — Ambulatory Visit (HOSPITAL_COMMUNITY)
Admission: RE | Admit: 2022-06-01 | Discharge: 2022-06-01 | Disposition: A | Discharge status: Home / Self Care | Payer: 59 | Source: Ambulatory Visit | Attending: Vascular Surgery | Admitting: Vascular Surgery

## 2022-06-01 ENCOUNTER — Encounter: Payer: Self-pay | Admitting: Vascular Surgery

## 2022-06-01 VITALS — BP 150/97 | HR 70 | Temp 99.0°F | Resp 20 | Ht 76.0 in | Wt 254.0 lb

## 2022-06-01 DIAGNOSIS — Z992 Dependence on renal dialysis: Secondary | ICD-10-CM | POA: Diagnosis not present

## 2022-06-01 DIAGNOSIS — N186 End stage renal disease: Secondary | ICD-10-CM | POA: Diagnosis not present

## 2022-06-10 DIAGNOSIS — I129 Hypertensive chronic kidney disease with stage 1 through stage 4 chronic kidney disease, or unspecified chronic kidney disease: Secondary | ICD-10-CM | POA: Diagnosis not present

## 2022-06-10 DIAGNOSIS — N186 End stage renal disease: Secondary | ICD-10-CM | POA: Diagnosis not present

## 2022-06-10 DIAGNOSIS — Z992 Dependence on renal dialysis: Secondary | ICD-10-CM | POA: Diagnosis not present

## 2022-08-24 ENCOUNTER — Emergency Department (HOSPITAL_COMMUNITY)
Admission: EM | Admit: 2022-08-24 | Discharge: 2022-08-25 | Disposition: A | Discharge status: Home / Self Care | Payer: Medicaid Other | Attending: Emergency Medicine | Admitting: Emergency Medicine

## 2022-08-24 ENCOUNTER — Emergency Department (HOSPITAL_COMMUNITY): Payer: Medicaid Other

## 2022-08-24 ENCOUNTER — Encounter (HOSPITAL_COMMUNITY): Payer: Self-pay | Admitting: Emergency Medicine

## 2022-08-24 ENCOUNTER — Other Ambulatory Visit: Payer: Self-pay

## 2022-08-24 DIAGNOSIS — Z1152 Encounter for screening for COVID-19: Secondary | ICD-10-CM | POA: Insufficient documentation

## 2022-08-24 DIAGNOSIS — Z992 Dependence on renal dialysis: Secondary | ICD-10-CM | POA: Diagnosis not present

## 2022-08-24 DIAGNOSIS — R059 Cough, unspecified: Secondary | ICD-10-CM | POA: Insufficient documentation

## 2022-08-24 LAB — COMPREHENSIVE METABOLIC PANEL
ALT: 14 U/L (ref 0–44)
AST: 14 U/L — ABNORMAL LOW (ref 15–41)
Albumin: 2.8 g/dL — ABNORMAL LOW (ref 3.5–5.0)
Alkaline Phosphatase: 47 U/L (ref 38–126)
Anion gap: 12 (ref 5–15)
BUN: 43 mg/dL — ABNORMAL HIGH (ref 6–20)
CO2: 27 mmol/L (ref 22–32)
Calcium: 9.7 mg/dL (ref 8.9–10.3)
Chloride: 104 mmol/L (ref 98–111)
Creatinine, Ser: 12.97 mg/dL — ABNORMAL HIGH (ref 0.61–1.24)
GFR, Estimated: 4 mL/min — ABNORMAL LOW (ref >60)
Glucose, Bld: 98 mg/dL (ref 70–99)
Potassium: 3.9 mmol/L (ref 3.5–5.1)
Sodium: 143 mmol/L (ref 135–145)
Total Bilirubin: 0.5 mg/dL (ref 0.3–1.2)
Total Protein: 5.3 g/dL — ABNORMAL LOW (ref 6.5–8.1)

## 2022-08-24 LAB — CBC WITH DIFFERENTIAL/PLATELET
Abs Immature Granulocytes: 0.01 10*3/uL (ref 0.00–0.07)
Basophils Absolute: 0 10*3/uL (ref 0.0–0.1)
Basophils Relative: 1 %
Eosinophils Absolute: 0.2 10*3/uL (ref 0.0–0.5)
Eosinophils Relative: 5 %
HCT: 28.9 % — ABNORMAL LOW (ref 39.0–52.0)
Hemoglobin: 9.6 g/dL — ABNORMAL LOW (ref 13.0–17.0)
Immature Granulocytes: 0 %
Lymphocytes Relative: 42 %
Lymphs Abs: 2.2 10*3/uL (ref 0.7–4.0)
MCH: 32.1 pg (ref 26.0–34.0)
MCHC: 33.2 g/dL (ref 30.0–36.0)
MCV: 96.7 fL (ref 80.0–100.0)
Monocytes Absolute: 0.4 10*3/uL (ref 0.1–1.0)
Monocytes Relative: 9 %
Neutro Abs: 2.2 10*3/uL (ref 1.7–7.7)
Neutrophils Relative %: 43 %
Platelets: 192 10*3/uL (ref 150–400)
RBC: 2.99 MIL/uL — ABNORMAL LOW (ref 4.22–5.81)
RDW: 13.9 % (ref 11.5–15.5)
WBC: 5.1 10*3/uL (ref 4.0–10.5)
nRBC: 0 % (ref 0.0–0.2)

## 2022-08-24 LAB — TROPONIN I (HIGH SENSITIVITY): Troponin I (High Sensitivity): 67 ng/L — ABNORMAL HIGH (ref <18)

## 2022-08-24 NOTE — ED Triage Notes (Signed)
Pt complains of sob since last week. It started only when he was laying down at night, but as progressed to feeling this way all the time. Pt receives dialysis MWF- has not missed any treatments. Pt states they tried to pull extra fluid off with no relief. Pt denies CP

## 2022-08-24 NOTE — ED Provider Triage Note (Signed)
Emergency Medicine Provider Triage Evaluation Note  JAYMIAN BOGART , a 56 y.o. male  was evaluated in triage.  Pt complains of SOB that is worse at night, now has progressed to sob all the time. Sx onset 2 weeks ago. Currently on dialysis. No N/V. Dialysis M/W/F. Has not missed any sessions.   Review of Systems  Positive: SOB Negative: Chest pain  Physical Exam  BP (!) 151/80 (BP Location: Right Arm)   Pulse 60   Temp 98.4 F (36.9 C) (Oral)   Resp 18   SpO2 97%  Gen:   Awake, no distress   Resp:  Normal effort  MSK:   Moves extremities without difficulty    Medical Decision Making  Medically screening exam initiated at 3:02 PM.  Appropriate orders placed.  Cherylin Mylar was informed that the remainder of the evaluation will be completed by another provider, this initial triage assessment does not replace that evaluation, and the importance of remaining in the ED until their evaluation is complete.    Osvaldo Shipper, [place] 08/24/22 1504

## 2022-08-25 LAB — RESP PANEL BY RT-PCR (FLU A&B, COVID) ARPGX2
Influenza A by PCR: NEGATIVE
Influenza B by PCR: NEGATIVE
SARS Coronavirus 2 by RT PCR: NEGATIVE

## 2022-08-25 LAB — TROPONIN I (HIGH SENSITIVITY): Troponin I (High Sensitivity): 64 ng/L — ABNORMAL HIGH (ref <18)

## 2022-08-25 MED ORDER — AZITHROMYCIN 250 MG PO TABS: ORAL_TABLET | ORAL | 0 refills | Status: DC

## 2022-08-25 MED ORDER — AMOXICILLIN 500 MG PO CAPS: 500.0000 mg | ORAL_CAPSULE | Freq: Three times a day (TID) | ORAL | 0 refills | Status: AC

## 2022-08-25 MED ORDER — ONDANSETRON HCL 4 MG/2ML IJ SOLN
4.0000 mg | Freq: Once | INTRAMUSCULAR | Status: AC
  Administered 2022-08-25: 4 mg via INTRAVENOUS
  Filled 2022-08-25: qty 2

## 2022-08-25 MED ORDER — SODIUM CHLORIDE 0.9 % IV SOLN
500.0000 mg | Freq: Once | INTRAVENOUS | Status: AC
  Filled 2022-08-25: qty 5

## 2022-08-25 MED ORDER — ONDANSETRON HCL 4 MG PO TABS: 4.0000 mg | ORAL_TABLET | Freq: Four times a day (QID) | ORAL | 0 refills | Status: AC

## 2022-08-25 NOTE — ED Provider Notes (Signed)
[facility]ERGENC[facility] DEPARTMENT Provider Note   CSN: [id number] Arrival date & time: 08/24/22  1425     History  Chief Complaint  Patient presents with   Shortness of Breath    Cameron Peterson is a 56 y.o. male.  56 year old male with prior medical history as detailed below presents for evaluation.  Patient is currently on dialysis.  His schedule is Monday Wednesday Friday.  His last complete dialysis session was on Monday of this week.  Patient reports persistent mild cough for the last month.  Patient reports that his dialysis center has been "running him dry" in an attempt to improve his cough.  Patient denies any edema to his lower extremities.  Patient denies any exertional shortness of breath.  Patient reports that sometimes he has more cough when he lays flat.  He denies chest pain.  He denies fever.  He denies recent illness.  The history is provided by the patient and medical records.       Home Medications Prior to Admission medications   Medication Sig Start Date End Date Taking? Authorizing Provider  allopurinol (ZYLOPRIM) 100 MG tablet Take 1.5 tablets (150 mg total) by mouth daily. 03/18/22   Rice, Resa Miner, MD  atorvastatin (LIPITOR) 20 MG tablet Take 20 mg by mouth daily. 01/19/22   [provider]  AURYXIA 1 GM 210 MG(Fe) tablet Take 210 mg by mouth 3 (three) times daily with meals. 04/08/22   [provider]  B Complex-C-Folic Acid (DIALYVITE TABLET) TABS Take 1 tablet by mouth daily. 03/02/22   [provider]  carvedilol (COREG) 3.125 MG tablet Take 1 tablet (3.125 mg total) by mouth 2 (two) times daily with a meal. 02/04/15   Rama, Venetia Maxon, MD  losartan (COZAAR) 25 MG tablet Take 25 mg by mouth in the morning and at bedtime.    [provider]  Tetrahydrozoline HCl (VISINE OP) Place 1 drop into both eyes daily as needed (dry eyes).    [provider]      Allergies    Lisinopril,  Shellfish allergy, and Other    Review of Systems   Review of Systems  All other systems reviewed and are negative.   Physical Exam Updated Vital Signs BP (!) 161/91 (BP Location: Right Arm)   Pulse 64   Temp 97.9 F (36.6 C) (Oral)   Resp 19   Ht 6\' 4"  (1.93 m)   Wt 110.2 kg   SpO2 97%   BMI 29.58 kg/m  Physical Exam Vitals and nursing note reviewed.  Constitutional:      General: He is not in acute distress.    Appearance: Normal appearance. He is well-developed.  HENT:     Head: Normocephalic and atraumatic.  Eyes:     Conjunctiva/sclera: Conjunctivae normal.     Pupils: Pupils are equal, round, and reactive to light.  Cardiovascular:     Rate and Rhythm: Normal rate and regular rhythm.     Heart sounds: Normal heart sounds.  Pulmonary:     Effort: Pulmonary effort is normal. No respiratory distress.     Breath sounds: Normal breath sounds.  Abdominal:     General: There is no distension.     Palpations: Abdomen is soft.     Tenderness: There is no abdominal tenderness.  Musculoskeletal:        General: No deformity. Normal range of motion.     Cervical back: Normal range of motion and neck supple.  Skin:    General: Skin is warm and dry.  Neurological:     General: No focal deficit present.     Mental Status: He is alert and oriented to person, place, and time.     ED Results / Procedures / Treatments   Labs (all labs ordered are listed, but only abnormal results are displayed) Labs Reviewed  CBC WITH DIFFERENTIAL/PLATELET - Abnormal; Notable for the following components:      Result Value   RBC 2.99 (*)    Hemoglobin 9.6 (*)    HCT 28.9 (*)    All other components within normal limits  COMPREHENSIVE METABOLIC PANEL - Abnormal; Notable for the following components:   BUN 43 (*)    Creatinine, Ser 12.97 (*)    Total Protein 5.3 (*)    Albumin 2.8 (*)    AST 14 (*)    GFR, Estimated 4 (*)    All other components within normal limits  TROPONIN I  (HIGH SENSITIVITY) - Abnormal; Notable for the following components:   Troponin I (High Sensitivity) 67 (*)    All other components within normal limits  RESP PANEL BY RT-PCR (FLU A&B, COVID) [id number]  TROPONIN I (HIGH SENSITIVITY)    EKG EKG Interpretation  Date/Time:  Tuesday August 24 2022 14:29:30 EST Ventricular Rate:  66 PR Interval:    QRS Duration: 86 QT Interval:  446 QTC Calculation: 467 R Axis:   89 Text Interpretation: Atrial flutter with variable A-V block Septal infarct , age undetermined Abnormal ECG When compared with ECG of 19-Jan-2022 20:25, PREVIOUS ECG IS PRESENT Confirmed by Dene Gentry [phone]) on 08/25/2022 12:40:21 PM  Radiology DG Chest 2 View  Result Date: 08/24/2022 CLINICAL DATA:  Worsening dyspnea EXAM: CHEST - 2 VIEW COMPARISON:  Chest radiograph dated 02/02/2015 FINDINGS: Lines/tubes: Right internal jugular venous catheter tip projects over the right atrium. Chest: Mildly hyperinflated lungs. No focal consolidation. No substantial interstitial opacities. Pleura: No pneumothorax or pleural effusion. Heart/mediastinum: Enlarged cardiomediastinal silhouette is likely projectional. Bones: No acute osseous abnormality. IMPRESSION: No acute cardiopulmonary abnormality. Electronically Signed   By: Darrin Nipper M.D.   On: 08/24/2022 15:35    Procedures Procedures    Medications Ordered in ED Medications - No data to display  ED Course/ Medical Decision Making/ A&P                           Medical Decision Making Risk Prescription drug management.    Medical Screen Complete  This patient presented to the ED with complaint of cough.  This complaint involves an extensive number of treatment options. The initial differential diagnosis includes, but is not limited to, fluid overload, atypical infection, metabolic abnormality, etc.  This presentation is: Acute, Chronic, Self-Limited, Previously Undiagnosed, Uncertain Prognosis, Complicated, Systemic  Symptoms, and Threat to Life/Bodily Function  Patient is on hemodialysis.  Patient with complaint of persistent mild cough for the last month.  Patient without associated chest pain or shortness of breath.  Patient without fever.  Patient did miss his dialysis session today given lengthy wait time in the ED.  Patient with clear lungs on chest x-ray and on exam.  Room air pulse ox is 99%.  Case briefly discussed with nephrology Dr. Jonnie Finner.  Nephrology will assist with helping get patient a make-up dialysis session tomorrow.  Make-up session was scheduled for tomorrow.  However, patient declines make-up session.  He insists that he will be fine until Friday  and plans to go to his regular dialysis session on Friday.  COVID test is pending.  Patient may benefit from short Z-Pak prescription to cover atypical infection.  Importance of close follow-up is stressed.  Strict return precautions given and understood.   Additional history obtained:  External records from outside sources obtained and reviewed including prior ED visits and prior Inpatient records.    Lab Tests:  I ordered and personally interpreted labs.  The pertinent results include: CBC, CMP, troponin, COVID, flu   Imaging Studies ordered:  I ordered imaging studies including chest x-ray I independently visualized and interpreted obtained imaging which showed NAD I agree with the radiologist interpretation.   Cardiac Monitoring:  The patient was maintained on a cardiac monitor.  I personally viewed and interpreted the cardiac monitor which showed an underlying rhythm of: NSR  Problem List / ED Course:  Cough   Reevaluation:  After the interventions noted above, I reevaluated the patient and found that they have: stayed the same  Disposition:  After consideration of the diagnostic results and the patients response to treatment, I feel that the patent would benefit from outpatient follow-up.            Final Clinical Impression(s) / ED Diagnoses Final diagnoses:  Cough, unspecified type    Rx / DC Orders ED Discharge Orders          Ordered    azithromycin (ZITHROMAX Z-PAK) 250 MG tablet        08/25/22 1324              Valarie Merino, MD 08/25/22 [phone]

## 2022-08-25 NOTE — ED Notes (Signed)
Pt reports about a week ago when he tries to lay down in bed, he can't breath and has a cough. Pt reported this to dialysis and they started to increase fluid amount they were removing which didn't provide relief. Pt denies any increased edema. Pt reports shob is when he is laying down, not exertional. Pt MWF dialysis and is compliant.

## 2022-08-25 NOTE — ED Notes (Signed)
Order were given at 1600 by MD,to stop patient Cameron Peterson, because he was vomiting.

## 2022-08-25 NOTE — Progress Notes (Signed)
Contacted by nephrologist and ED provider with request that pt receive a HD treatment tomorrow to make-up missed treatment today. Contacted [facility] and spoke to staff. Clinic can treat pt tomorrow. Pt will need to arrive at 12:10 for 12:30 chair time. Spoke to pt via phone. Pt is not agreeable to treatment tomorrow and plans next treatment on Friday. Update provided to providers. ED provider spoke to pt as well and pt is not agreeable to treatment tomorrow. Returned called to [facility] and cancelled appt for tomorrow due to above reasons.   Melven Sartorius Renal Navigator [phone]

## 2022-08-25 NOTE — Discharge Instructions (Addendum)
Return for any problem.   Take Amoxicillin as prescribed.  This could make your cough better if you have an atypical lung infection.   Results of COVID and flu testing should result into your MyChart system.  If your COVID test is positive, this would be indicative of COVID exposure within the last month.  Thus, a fairly recent COVID infection could be a reason for your chronic cough.  Such a cough should gradually improve over time.  Other laboratory testing and imaging obtained today in the ED is without significant abnormality.  Please make sure that you follow-up closely with your dialysis center for your next dialysis session on Friday.

## 2023-01-14 IMAGING — US US RENAL
1 series · 14 of 25 positions shown · non-contrast
Comparison: Ultrasound 02/02/2015

CLINICAL DATA: Acute kidney injury

EXAM:
RENAL / URINARY TRACT ULTRASOUND COMPLETE

[Series 1: us renal · 14 of 43 slices shown]
[im 1/43]
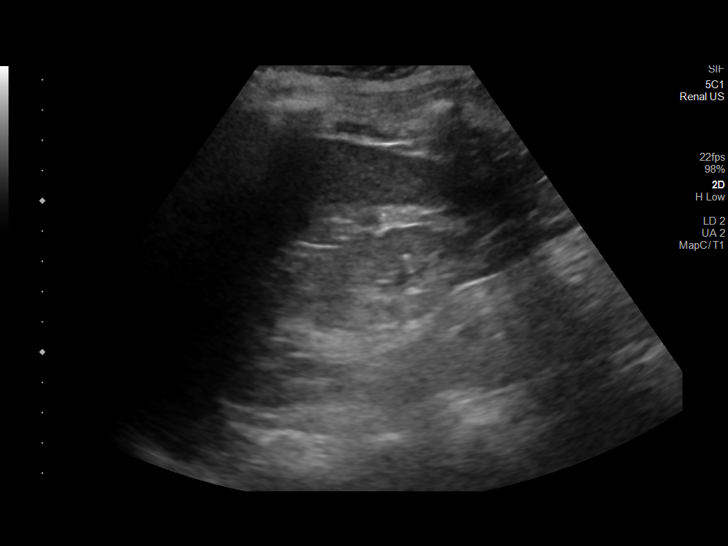
[im 4/43]
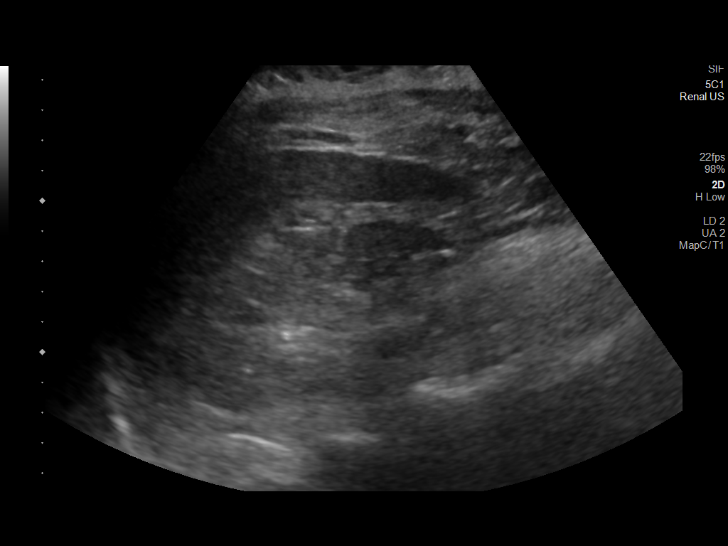
[im 8/43]
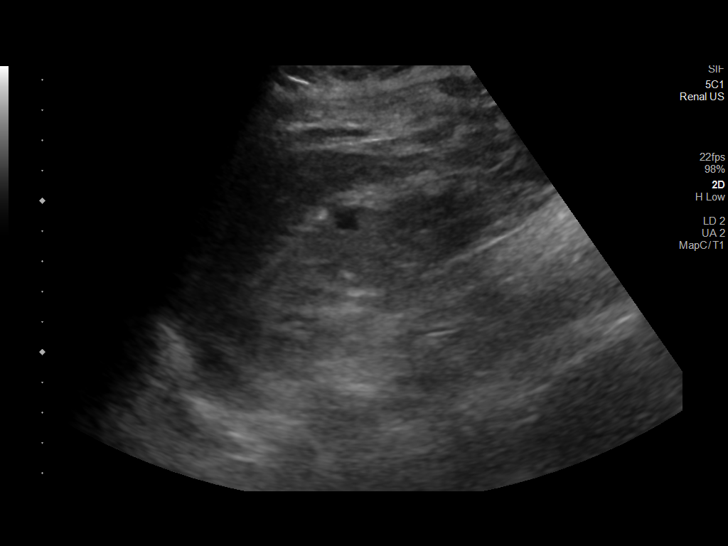
[im 11/43]
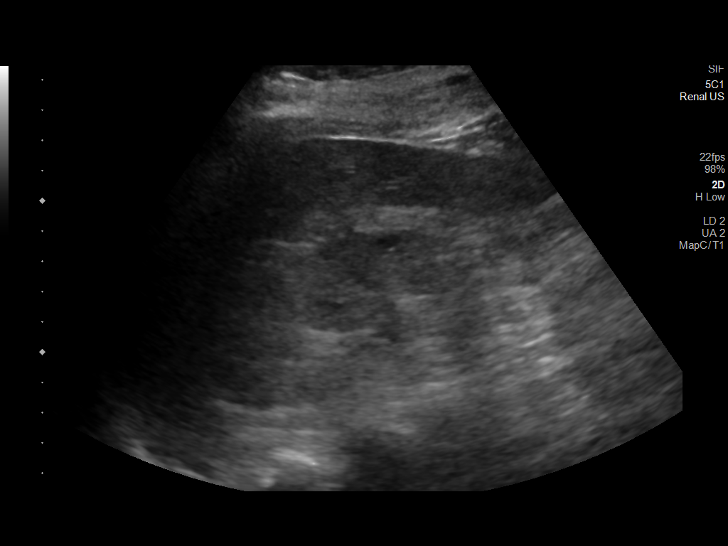
[im 15/43]
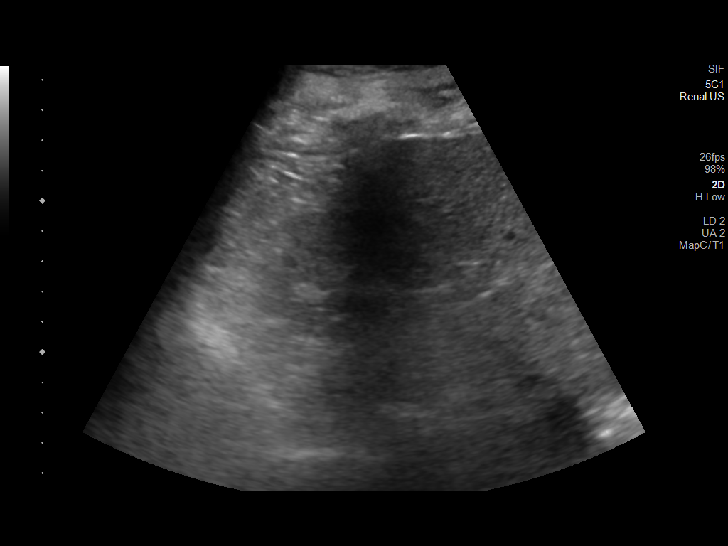
[im 16/43]
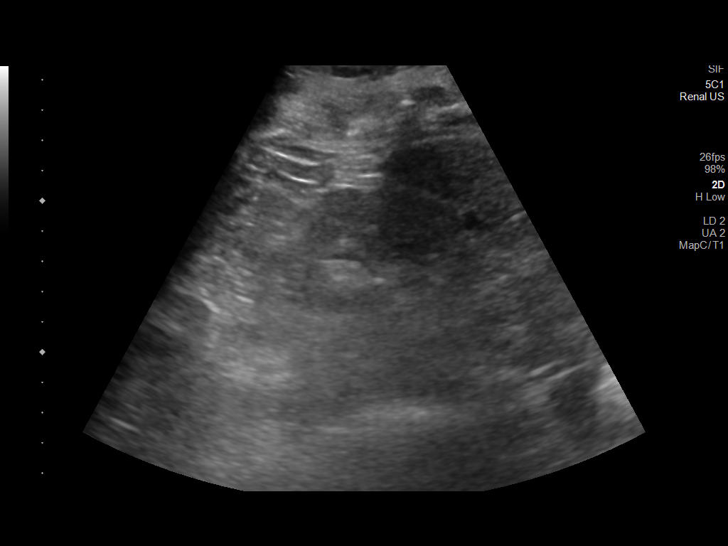
[im 20/43]
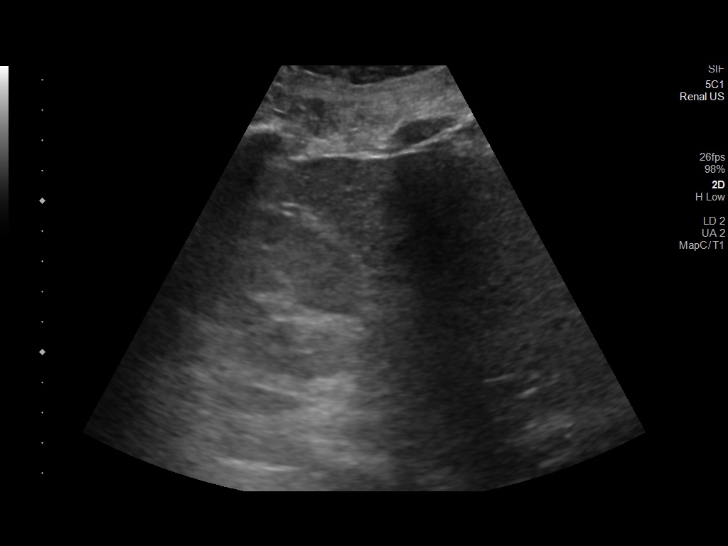
[im 23/43]
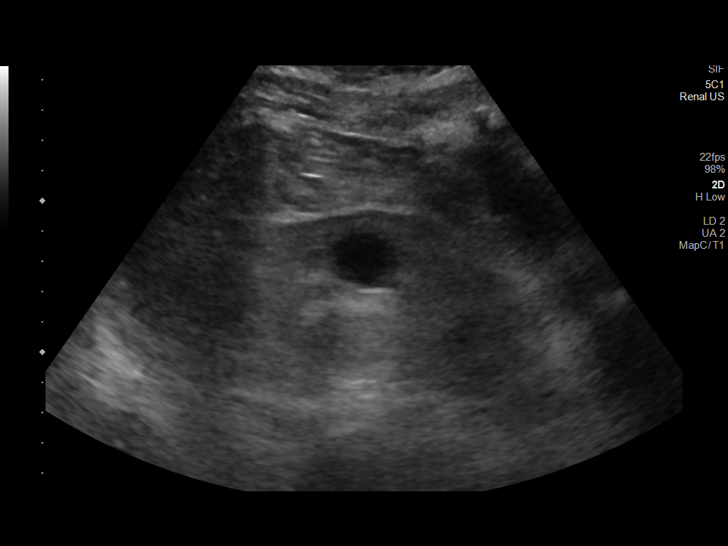
[im 27/43]
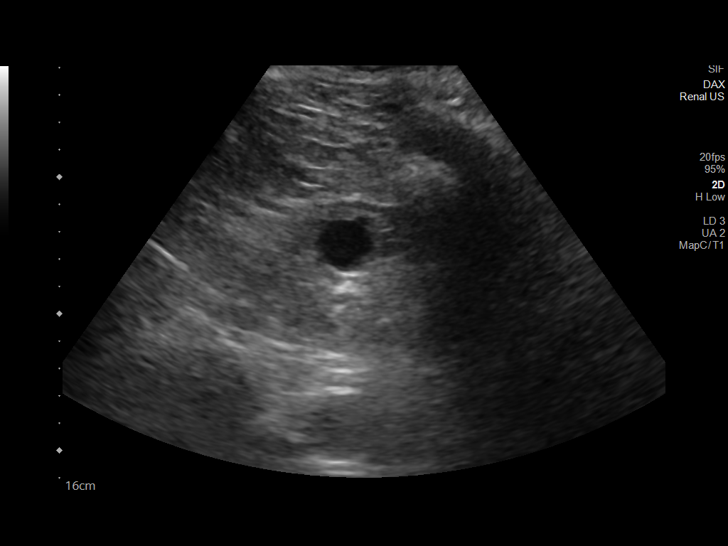
[im 29/43]
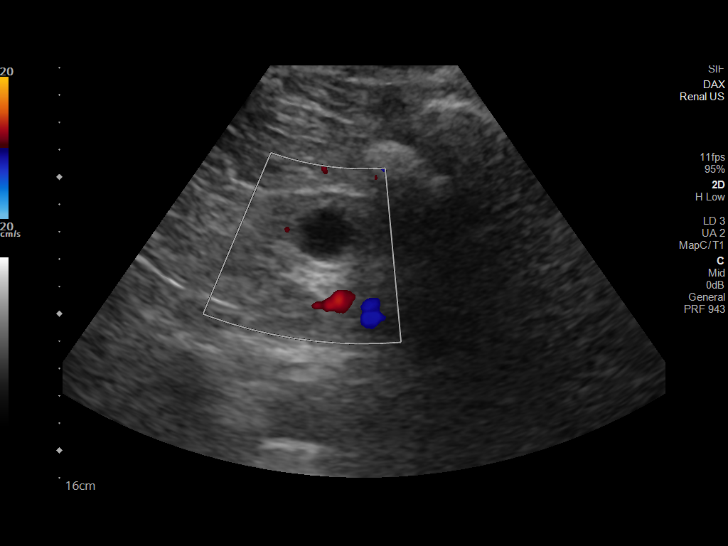
[im 32/43]
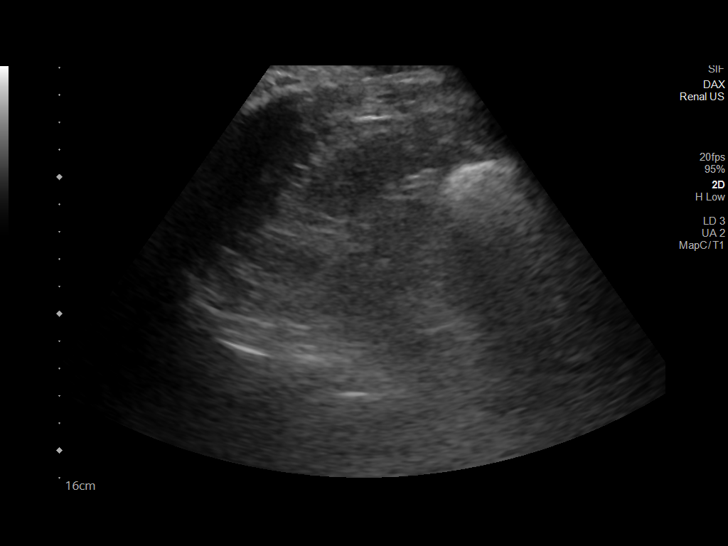
[im 36/43]
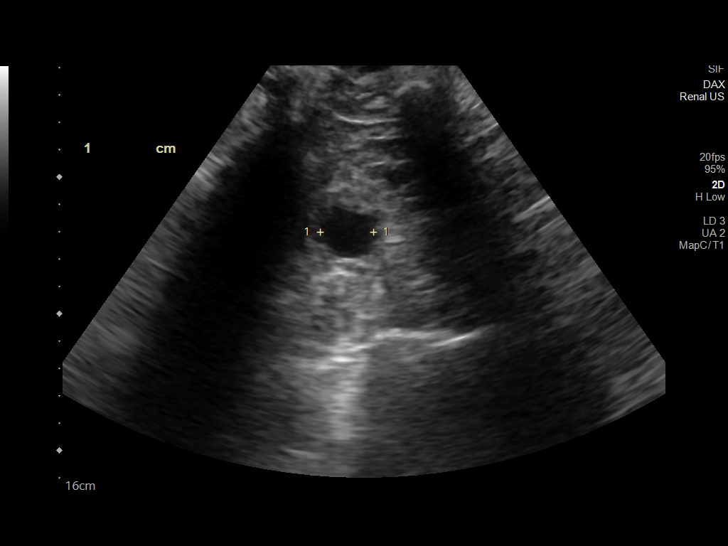
[im 39/43]
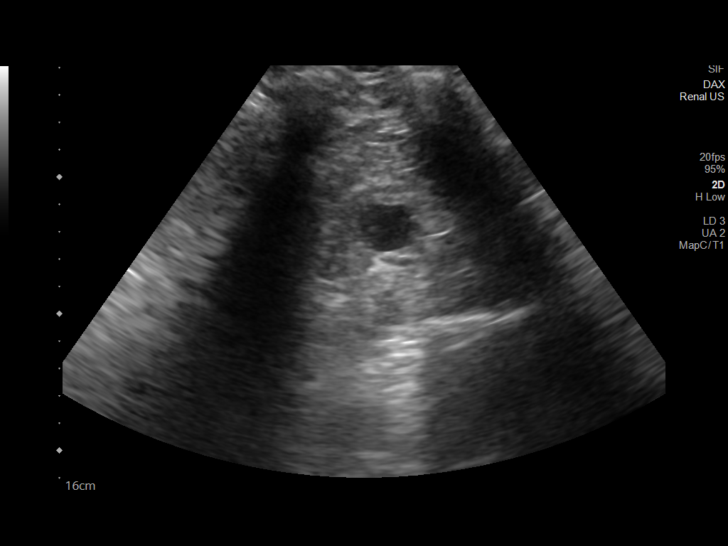
[im 43/43]
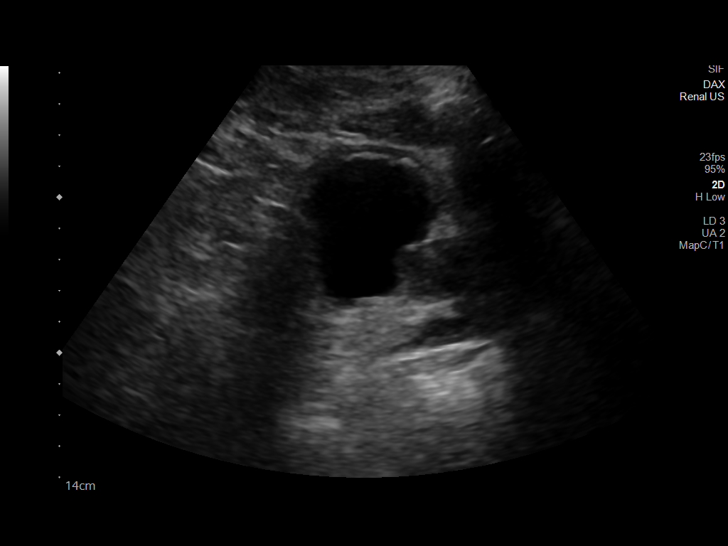

[14 of 25 positions shown; findings below may reference images not displayed]

FINDINGS: Right Kidney:

Renal measurements: 10.7 x 4.5 x 4.2 cm = volume: 104.3 mL.
Echogenic right kidney. No mass or hydronephrosis

Left Kidney:

Renal measurements: 10.7 x 5.4 x 5.2 cm = volume: 156.3 mL.
Echogenic left kidney. No hydronephrosis. Cyst at the mid to upper
pole measuring 2 cm, no follow-up imaging recommended

Bladder:

Appears normal for degree of bladder distention.

Other:

None.
IMPRESSION: 1. Echogenic kidneys bilaterally consistent with medical renal
disease.
2. Negative for hydronephrosis

## 2023-01-16 IMAGING — US IR FLUORO GUIDE CV LINE*R*
1 series · 3 of 3 positions shown · non-contrast
Comparison: none

INDICATION: DOGHAN. ESRD requiring HD.

[Series 1: ir fluoro guide cv line*right* · 3 of 3 slices shown]
[im 1/3]
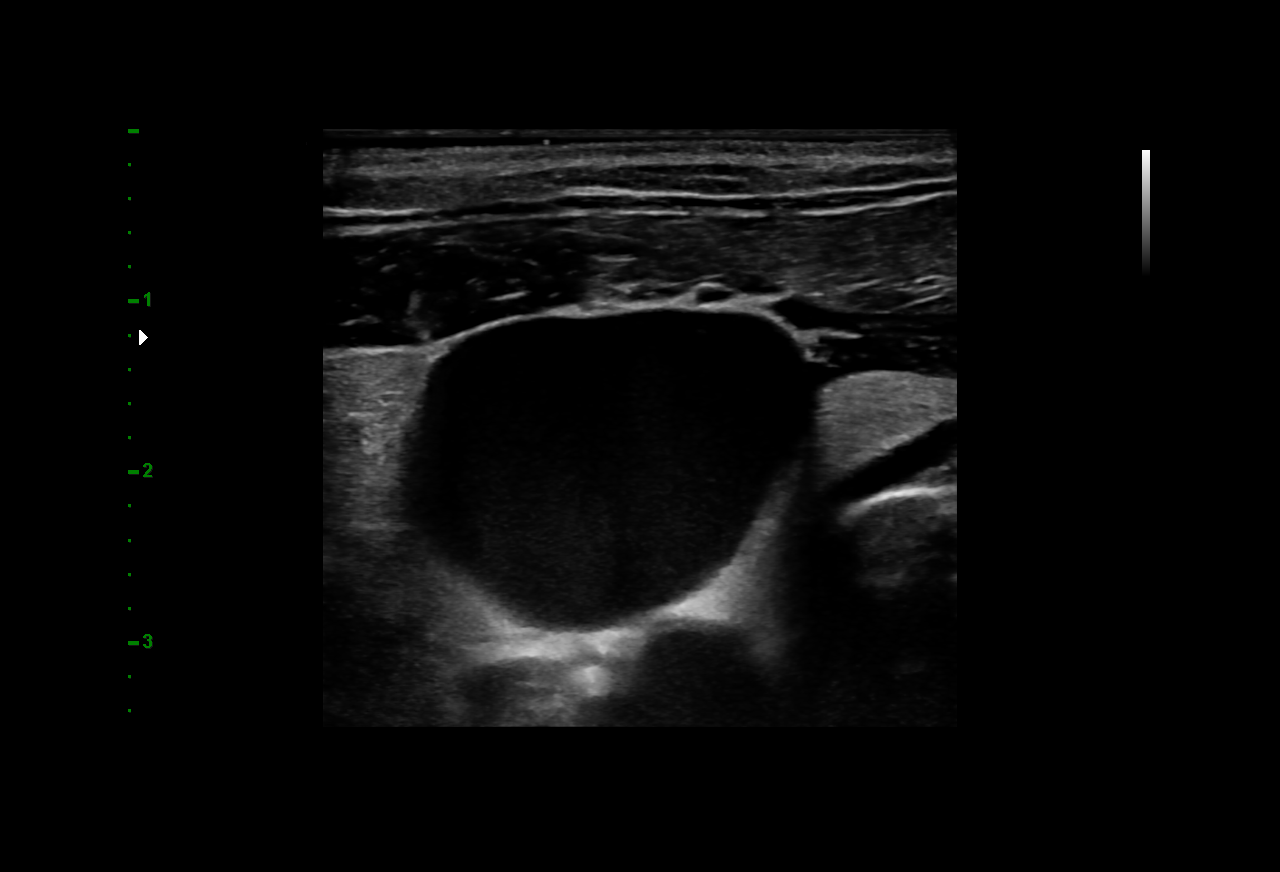
[im 2/3]
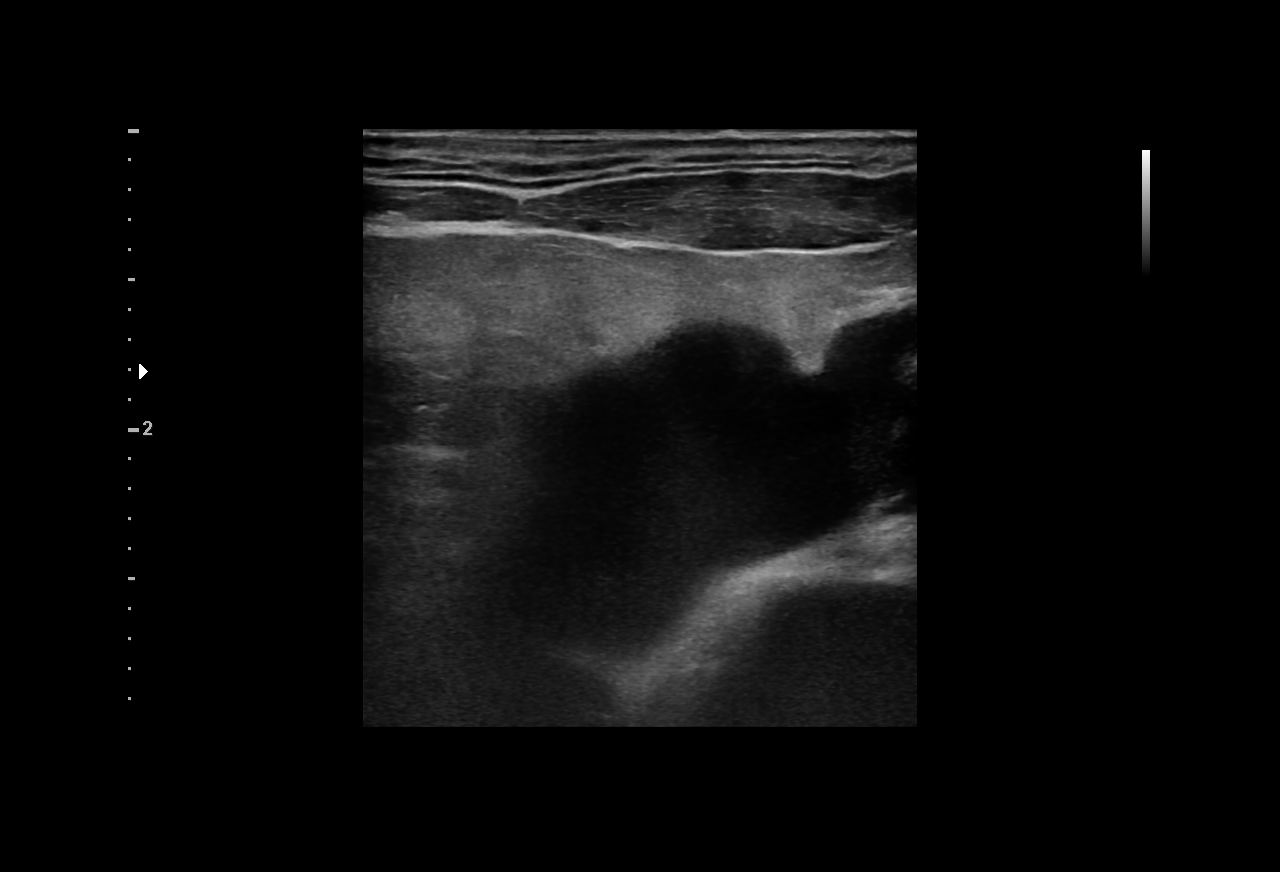
[im 3/3]
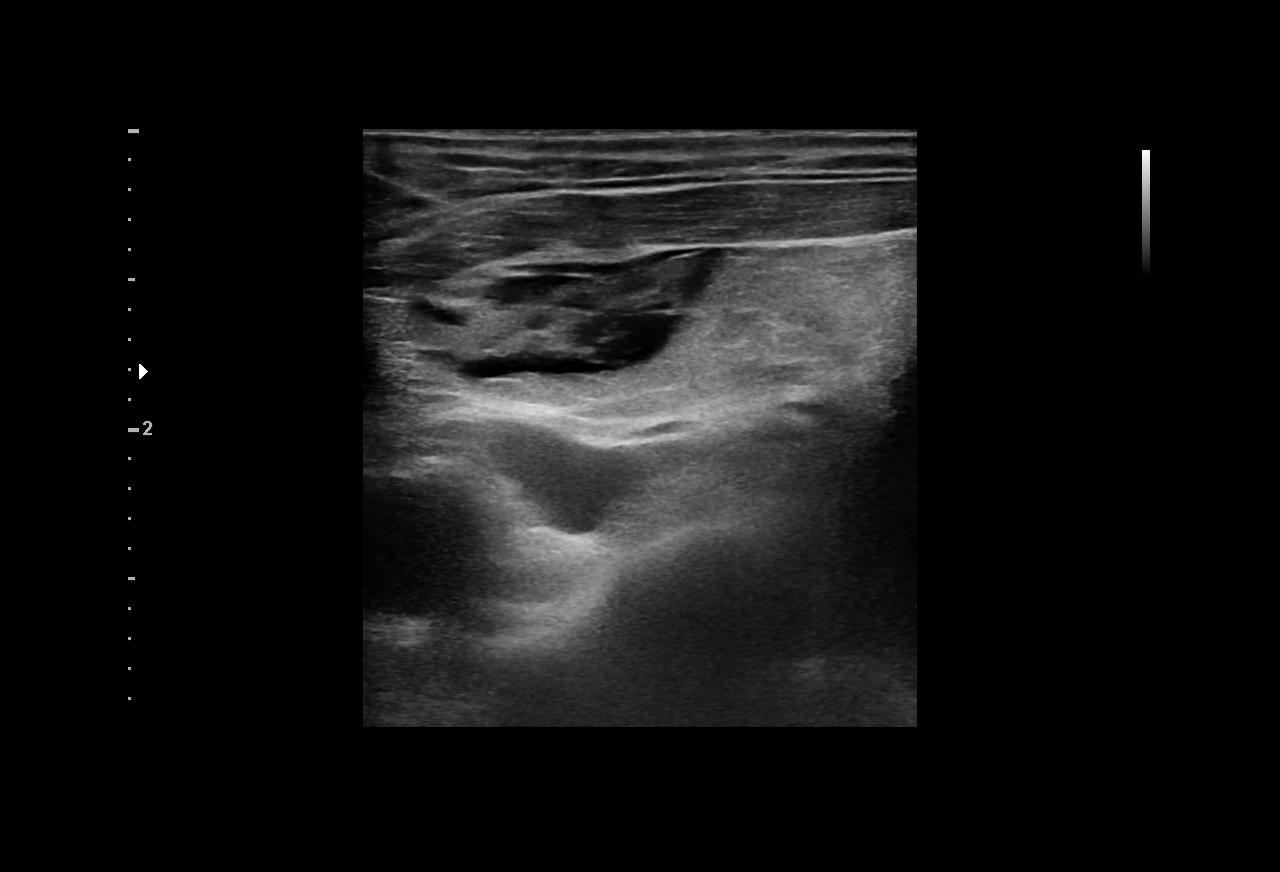

[3 of 3 positions shown; findings below may reference images not displayed]

EXAM:
TUNNELED CENTRAL VENOUS HEMODIALYSIS CATHETER PLACEMENT WITH
ULTRASOUND AND FLUOROSCOPIC GUIDANCE

MEDICATIONS:
Ancef 2 gm IV . The antibiotic was given in an appropriate time
interval prior to skin puncture.

ANESTHESIA/SEDATION:
Moderate (conscious) sedation was employed during this procedure. A
total of Versed 1 mg and Fentanyl 50 mcg was administered
intravenously.

Moderate Sedation Time: 14 minutes. The patient's level of
consciousness and vital signs were monitored continuously by
radiology nursing throughout the procedure under my direct
supervision.

FLUOROSCOPY TIME:  Fluoroscopic dose; 1 mGy

COMPLICATIONS:
None immediate.

PROCEDURE:
Informed written consent was obtained from the the patient and/or
patient's representative after a discussion of the risks, benefits,
and alternatives to treatment. Questions regarding the procedure
were encouraged and answered. The RIGHT neck and chest were prepped
with chlorhexidine in a sterile fashion, and a sterile drape was
applied covering the operative field. Maximum barrier sterile
technique with sterile gowns and gloves were used for the procedure.
A timeout was performed prior to the initiation of the procedure.

After creating a small venotomy incision, a micropuncture kit was
utilized to access the internal jugular vein. Real-time ultrasound
guidance was utilized for vascular access including the acquisition
of a permanent ultrasound image documenting patency of the accessed
vessel. The microwire was utilized to measure appropriate catheter
length.

A stiff Glidewire was advanced to the level of the IVC and the
micropuncture sheath was exchanged for a peel-away sheath. A
palindrome tunneled hemodialysis catheter measuring 23 cm from tip
to cuff was tunneled in a retrograde fashion from the anterior chest
wall to the venotomy incision.

The catheter was then placed through the peel-away sheath with tips
ultimately positioned within the superior aspect of the right
atrium. Final catheter positioning was confirmed and documented with
a spot radiographic image. The catheter aspirates and flushes
normally. The catheter was flushed with appropriate volume heparin
dwells.

The catheter exit site was secured with a 0-Silk retention suture.
The venotomy incision was closed wit Dermabond. Dressings were
applied. The patient tolerated the procedure well without immediate
post procedural complication.
IMPRESSION: Successful placement of 23 cm tip to cuff tunneled hemodialysis
catheter via the RIGHT internal jugular vein, as above.

The tip of the catheter is positioned within the proximal RIGHT
atrium. The catheter is ready for immediate use.
